# Patient Record
Sex: Female | Born: 1945
Health system: Southern US, Community
[De-identification: ages and names within clinical notes are randomized; demographics above are authoritative.]

## PROBLEM LIST (undated history)

## (undated) DIAGNOSIS — I10 Essential (primary) hypertension: Secondary | ICD-10-CM

## (undated) DIAGNOSIS — G709 Myoneural disorder, unspecified: Secondary | ICD-10-CM

## (undated) DIAGNOSIS — K219 Gastro-esophageal reflux disease without esophagitis: Secondary | ICD-10-CM

## (undated) HISTORY — PX: HERNIA REPAIR: SHX51

## (undated) HISTORY — DX: Myoneural disorder, unspecified: G70.9

## (undated) HISTORY — DX: Gastro-esophageal reflux disease without esophagitis: K21.9

## (undated) HISTORY — DX: Essential (primary) hypertension: I10

---

## 2011-01-08 DIAGNOSIS — R200 Anesthesia of skin: Secondary | ICD-10-CM | POA: Insufficient documentation

## 2011-01-31 DIAGNOSIS — J309 Allergic rhinitis, unspecified: Secondary | ICD-10-CM | POA: Insufficient documentation

## 2011-01-31 DIAGNOSIS — F32A Depression, unspecified: Secondary | ICD-10-CM | POA: Insufficient documentation

## 2011-03-26 DIAGNOSIS — S7410XA Injury of femoral nerve at hip and thigh level, unspecified leg, initial encounter: Secondary | ICD-10-CM | POA: Insufficient documentation

## 2011-12-29 DIAGNOSIS — Z780 Asymptomatic menopausal state: Secondary | ICD-10-CM | POA: Insufficient documentation

## 2011-12-29 DIAGNOSIS — H1010 Acute atopic conjunctivitis, unspecified eye: Secondary | ICD-10-CM | POA: Insufficient documentation

## 2011-12-29 DIAGNOSIS — H60549 Acute eczematoid otitis externa, unspecified ear: Secondary | ICD-10-CM | POA: Insufficient documentation

## 2011-12-29 DIAGNOSIS — N3281 Overactive bladder: Secondary | ICD-10-CM | POA: Insufficient documentation

## 2013-02-01 DIAGNOSIS — Z9889 Other specified postprocedural states: Secondary | ICD-10-CM | POA: Insufficient documentation

## 2013-02-01 DIAGNOSIS — K5900 Constipation, unspecified: Secondary | ICD-10-CM | POA: Insufficient documentation

## 2013-12-23 DIAGNOSIS — K449 Diaphragmatic hernia without obstruction or gangrene: Secondary | ICD-10-CM | POA: Insufficient documentation

## 2013-12-23 DIAGNOSIS — R9389 Abnormal findings on diagnostic imaging of other specified body structures: Secondary | ICD-10-CM | POA: Insufficient documentation

## 2013-12-23 DIAGNOSIS — K4120 Bilateral femoral hernia, without obstruction or gangrene, not specified as recurrent: Secondary | ICD-10-CM | POA: Insufficient documentation

## 2013-12-23 DIAGNOSIS — D649 Anemia, unspecified: Secondary | ICD-10-CM | POA: Insufficient documentation

## 2013-12-23 DIAGNOSIS — M810 Age-related osteoporosis without current pathological fracture: Secondary | ICD-10-CM | POA: Insufficient documentation

## 2013-12-23 DIAGNOSIS — R112 Nausea with vomiting, unspecified: Secondary | ICD-10-CM | POA: Insufficient documentation

## 2013-12-23 DIAGNOSIS — K4091 Unilateral inguinal hernia, without obstruction or gangrene, recurrent: Secondary | ICD-10-CM | POA: Insufficient documentation

## 2013-12-23 DIAGNOSIS — R1310 Dysphagia, unspecified: Secondary | ICD-10-CM | POA: Insufficient documentation

## 2013-12-23 DIAGNOSIS — S8266XA Nondisplaced fracture of lateral malleolus of unspecified fibula, initial encounter for closed fracture: Secondary | ICD-10-CM | POA: Insufficient documentation

## 2013-12-24 DIAGNOSIS — M659 Synovitis and tenosynovitis, unspecified: Secondary | ICD-10-CM | POA: Insufficient documentation

## 2013-12-24 DIAGNOSIS — J019 Acute sinusitis, unspecified: Secondary | ICD-10-CM | POA: Insufficient documentation

## 2015-05-14 DIAGNOSIS — R4589 Other symptoms and signs involving emotional state: Secondary | ICD-10-CM | POA: Diagnosis not present

## 2015-05-14 DIAGNOSIS — R32 Unspecified urinary incontinence: Secondary | ICD-10-CM | POA: Diagnosis not present

## 2015-05-14 DIAGNOSIS — I10 Essential (primary) hypertension: Secondary | ICD-10-CM | POA: Diagnosis not present

## 2015-08-31 DIAGNOSIS — Z1231 Encounter for screening mammogram for malignant neoplasm of breast: Secondary | ICD-10-CM | POA: Diagnosis not present

## 2015-09-19 DIAGNOSIS — S60222A Contusion of left hand, initial encounter: Secondary | ICD-10-CM | POA: Diagnosis not present

## 2015-11-16 DIAGNOSIS — Z1159 Encounter for screening for other viral diseases: Secondary | ICD-10-CM | POA: Diagnosis not present

## 2015-11-16 DIAGNOSIS — R5382 Chronic fatigue, unspecified: Secondary | ICD-10-CM | POA: Diagnosis not present

## 2015-11-16 DIAGNOSIS — Z0000 Encounter for general adult medical examination without abnormal findings: Secondary | ICD-10-CM | POA: Diagnosis not present

## 2015-11-16 DIAGNOSIS — D649 Anemia, unspecified: Secondary | ICD-10-CM | POA: Diagnosis not present

## 2015-11-16 DIAGNOSIS — Z13820 Encounter for screening for osteoporosis: Secondary | ICD-10-CM | POA: Diagnosis not present

## 2015-11-16 DIAGNOSIS — I10 Essential (primary) hypertension: Secondary | ICD-10-CM | POA: Diagnosis not present

## 2015-11-16 DIAGNOSIS — Z23 Encounter for immunization: Secondary | ICD-10-CM | POA: Diagnosis not present

## 2015-11-29 DIAGNOSIS — Z780 Asymptomatic menopausal state: Secondary | ICD-10-CM | POA: Diagnosis not present

## 2015-11-29 DIAGNOSIS — M8588 Other specified disorders of bone density and structure, other site: Secondary | ICD-10-CM | POA: Diagnosis not present

## 2015-12-11 DIAGNOSIS — H5201 Hypermetropia, right eye: Secondary | ICD-10-CM | POA: Diagnosis not present

## 2015-12-11 DIAGNOSIS — H2513 Age-related nuclear cataract, bilateral: Secondary | ICD-10-CM | POA: Diagnosis not present

## 2015-12-11 DIAGNOSIS — H524 Presbyopia: Secondary | ICD-10-CM | POA: Diagnosis not present

## 2015-12-11 DIAGNOSIS — H52223 Regular astigmatism, bilateral: Secondary | ICD-10-CM | POA: Diagnosis not present

## 2015-12-11 DIAGNOSIS — H5212 Myopia, left eye: Secondary | ICD-10-CM | POA: Diagnosis not present

## 2016-02-22 DIAGNOSIS — I10 Essential (primary) hypertension: Secondary | ICD-10-CM | POA: Diagnosis not present

## 2016-02-22 DIAGNOSIS — J40 Bronchitis, not specified as acute or chronic: Secondary | ICD-10-CM | POA: Diagnosis not present

## 2016-02-22 DIAGNOSIS — M818 Other osteoporosis without current pathological fracture: Secondary | ICD-10-CM | POA: Diagnosis not present

## 2016-05-15 DIAGNOSIS — I10 Essential (primary) hypertension: Secondary | ICD-10-CM | POA: Diagnosis not present

## 2016-05-22 DIAGNOSIS — D649 Anemia, unspecified: Secondary | ICD-10-CM | POA: Diagnosis not present

## 2016-05-22 DIAGNOSIS — R5382 Chronic fatigue, unspecified: Secondary | ICD-10-CM | POA: Diagnosis not present

## 2016-05-22 DIAGNOSIS — F430 Acute stress reaction: Secondary | ICD-10-CM | POA: Diagnosis not present

## 2016-05-22 DIAGNOSIS — M810 Age-related osteoporosis without current pathological fracture: Secondary | ICD-10-CM | POA: Diagnosis not present

## 2016-05-22 DIAGNOSIS — I10 Essential (primary) hypertension: Secondary | ICD-10-CM | POA: Diagnosis not present

## 2016-05-22 DIAGNOSIS — R413 Other amnesia: Secondary | ICD-10-CM | POA: Diagnosis not present

## 2016-05-22 DIAGNOSIS — F329 Major depressive disorder, single episode, unspecified: Secondary | ICD-10-CM | POA: Diagnosis not present

## 2016-07-11 DIAGNOSIS — N898 Other specified noninflammatory disorders of vagina: Secondary | ICD-10-CM | POA: Diagnosis not present

## 2016-07-11 DIAGNOSIS — R197 Diarrhea, unspecified: Secondary | ICD-10-CM | POA: Diagnosis not present

## 2016-08-14 DIAGNOSIS — Z1321 Encounter for screening for nutritional disorder: Secondary | ICD-10-CM | POA: Diagnosis not present

## 2016-08-14 DIAGNOSIS — M8580 Other specified disorders of bone density and structure, unspecified site: Secondary | ICD-10-CM | POA: Diagnosis not present

## 2016-08-14 DIAGNOSIS — Z79899 Other long term (current) drug therapy: Secondary | ICD-10-CM | POA: Diagnosis not present

## 2016-08-14 DIAGNOSIS — Z87891 Personal history of nicotine dependence: Secondary | ICD-10-CM | POA: Diagnosis not present

## 2016-08-25 DIAGNOSIS — Z87891 Personal history of nicotine dependence: Secondary | ICD-10-CM | POA: Diagnosis not present

## 2016-08-25 DIAGNOSIS — Z79899 Other long term (current) drug therapy: Secondary | ICD-10-CM | POA: Diagnosis not present

## 2016-08-25 DIAGNOSIS — Z1321 Encounter for screening for nutritional disorder: Secondary | ICD-10-CM | POA: Diagnosis not present

## 2016-08-25 DIAGNOSIS — M8580 Other specified disorders of bone density and structure, unspecified site: Secondary | ICD-10-CM | POA: Diagnosis not present

## 2016-09-03 DIAGNOSIS — M170 Bilateral primary osteoarthritis of knee: Secondary | ICD-10-CM | POA: Diagnosis not present

## 2016-09-15 DIAGNOSIS — Z1231 Encounter for screening mammogram for malignant neoplasm of breast: Secondary | ICD-10-CM | POA: Diagnosis not present

## 2016-09-18 DIAGNOSIS — M8580 Other specified disorders of bone density and structure, unspecified site: Secondary | ICD-10-CM | POA: Diagnosis not present

## 2016-09-19 DIAGNOSIS — D241 Benign neoplasm of right breast: Secondary | ICD-10-CM | POA: Diagnosis not present

## 2016-09-19 DIAGNOSIS — R922 Inconclusive mammogram: Secondary | ICD-10-CM | POA: Diagnosis not present

## 2016-11-27 DIAGNOSIS — I10 Essential (primary) hypertension: Secondary | ICD-10-CM | POA: Diagnosis not present

## 2016-12-17 DIAGNOSIS — Z1211 Encounter for screening for malignant neoplasm of colon: Secondary | ICD-10-CM | POA: Diagnosis not present

## 2016-12-17 DIAGNOSIS — Z1212 Encounter for screening for malignant neoplasm of rectum: Secondary | ICD-10-CM | POA: Diagnosis not present

## 2016-12-29 DIAGNOSIS — M170 Bilateral primary osteoarthritis of knee: Secondary | ICD-10-CM | POA: Insufficient documentation

## 2017-05-12 DIAGNOSIS — J029 Acute pharyngitis, unspecified: Secondary | ICD-10-CM | POA: Diagnosis not present

## 2017-05-12 DIAGNOSIS — R05 Cough: Secondary | ICD-10-CM | POA: Diagnosis not present

## 2017-05-12 DIAGNOSIS — J329 Chronic sinusitis, unspecified: Secondary | ICD-10-CM | POA: Diagnosis not present

## 2017-05-12 DIAGNOSIS — I10 Essential (primary) hypertension: Secondary | ICD-10-CM | POA: Diagnosis not present

## 2017-06-02 DIAGNOSIS — Z0000 Encounter for general adult medical examination without abnormal findings: Secondary | ICD-10-CM | POA: Diagnosis not present

## 2017-06-02 DIAGNOSIS — Z1211 Encounter for screening for malignant neoplasm of colon: Secondary | ICD-10-CM | POA: Diagnosis not present

## 2017-06-02 DIAGNOSIS — M8580 Other specified disorders of bone density and structure, unspecified site: Secondary | ICD-10-CM | POA: Diagnosis not present

## 2017-06-02 DIAGNOSIS — Z23 Encounter for immunization: Secondary | ICD-10-CM | POA: Diagnosis not present

## 2017-06-02 DIAGNOSIS — I10 Essential (primary) hypertension: Secondary | ICD-10-CM | POA: Diagnosis not present

## 2017-06-02 DIAGNOSIS — F329 Major depressive disorder, single episode, unspecified: Secondary | ICD-10-CM | POA: Diagnosis not present

## 2017-06-02 DIAGNOSIS — E559 Vitamin D deficiency, unspecified: Secondary | ICD-10-CM | POA: Diagnosis not present

## 2017-06-19 DIAGNOSIS — S61011A Laceration without foreign body of right thumb without damage to nail, initial encounter: Secondary | ICD-10-CM | POA: Diagnosis not present

## 2017-07-01 DIAGNOSIS — L853 Xerosis cutis: Secondary | ICD-10-CM | POA: Diagnosis not present

## 2017-07-01 DIAGNOSIS — L218 Other seborrheic dermatitis: Secondary | ICD-10-CM | POA: Diagnosis not present

## 2017-08-03 DIAGNOSIS — M25461 Effusion, right knee: Secondary | ICD-10-CM | POA: Diagnosis not present

## 2017-08-03 DIAGNOSIS — M25561 Pain in right knee: Secondary | ICD-10-CM | POA: Diagnosis not present

## 2017-08-03 DIAGNOSIS — M1712 Unilateral primary osteoarthritis, left knee: Secondary | ICD-10-CM | POA: Diagnosis not present

## 2017-08-03 DIAGNOSIS — M1711 Unilateral primary osteoarthritis, right knee: Secondary | ICD-10-CM | POA: Diagnosis not present

## 2017-08-06 DIAGNOSIS — E559 Vitamin D deficiency, unspecified: Secondary | ICD-10-CM | POA: Diagnosis not present

## 2017-08-06 DIAGNOSIS — M8580 Other specified disorders of bone density and structure, unspecified site: Secondary | ICD-10-CM | POA: Diagnosis not present

## 2017-08-06 DIAGNOSIS — I10 Essential (primary) hypertension: Secondary | ICD-10-CM | POA: Diagnosis not present

## 2017-08-06 DIAGNOSIS — M1711 Unilateral primary osteoarthritis, right knee: Secondary | ICD-10-CM | POA: Insufficient documentation

## 2017-08-11 DIAGNOSIS — I10 Essential (primary) hypertension: Secondary | ICD-10-CM | POA: Diagnosis not present

## 2017-08-11 DIAGNOSIS — M8580 Other specified disorders of bone density and structure, unspecified site: Secondary | ICD-10-CM | POA: Diagnosis not present

## 2017-08-13 DIAGNOSIS — M1711 Unilateral primary osteoarthritis, right knee: Secondary | ICD-10-CM | POA: Diagnosis not present

## 2017-08-13 DIAGNOSIS — M659 Synovitis and tenosynovitis, unspecified: Secondary | ICD-10-CM | POA: Diagnosis not present

## 2017-08-26 DIAGNOSIS — M25461 Effusion, right knee: Secondary | ICD-10-CM | POA: Diagnosis not present

## 2017-08-26 DIAGNOSIS — M1711 Unilateral primary osteoarthritis, right knee: Secondary | ICD-10-CM | POA: Diagnosis not present

## 2017-08-26 DIAGNOSIS — G5721 Lesion of femoral nerve, right lower limb: Secondary | ICD-10-CM | POA: Diagnosis not present

## 2017-08-26 DIAGNOSIS — M25561 Pain in right knee: Secondary | ICD-10-CM | POA: Diagnosis not present

## 2017-08-27 DIAGNOSIS — E559 Vitamin D deficiency, unspecified: Secondary | ICD-10-CM | POA: Diagnosis not present

## 2017-08-27 DIAGNOSIS — Z1321 Encounter for screening for nutritional disorder: Secondary | ICD-10-CM | POA: Diagnosis not present

## 2017-08-27 DIAGNOSIS — Z87891 Personal history of nicotine dependence: Secondary | ICD-10-CM | POA: Diagnosis not present

## 2017-08-27 DIAGNOSIS — Z79899 Other long term (current) drug therapy: Secondary | ICD-10-CM | POA: Diagnosis not present

## 2017-08-27 DIAGNOSIS — M8580 Other specified disorders of bone density and structure, unspecified site: Secondary | ICD-10-CM | POA: Diagnosis not present

## 2017-09-02 DIAGNOSIS — M8580 Other specified disorders of bone density and structure, unspecified site: Secondary | ICD-10-CM | POA: Diagnosis not present

## 2017-09-30 DIAGNOSIS — M8580 Other specified disorders of bone density and structure, unspecified site: Secondary | ICD-10-CM | POA: Diagnosis not present

## 2017-10-08 DIAGNOSIS — Z1231 Encounter for screening mammogram for malignant neoplasm of breast: Secondary | ICD-10-CM | POA: Diagnosis not present

## 2018-01-04 DIAGNOSIS — M8589 Other specified disorders of bone density and structure, multiple sites: Secondary | ICD-10-CM | POA: Diagnosis not present

## 2018-01-19 DIAGNOSIS — M1712 Unilateral primary osteoarthritis, left knee: Secondary | ICD-10-CM | POA: Diagnosis not present

## 2018-02-08 DIAGNOSIS — I10 Essential (primary) hypertension: Secondary | ICD-10-CM | POA: Diagnosis not present

## 2018-02-15 DIAGNOSIS — K219 Gastro-esophageal reflux disease without esophagitis: Secondary | ICD-10-CM | POA: Diagnosis not present

## 2018-02-15 DIAGNOSIS — I10 Essential (primary) hypertension: Secondary | ICD-10-CM | POA: Diagnosis not present

## 2018-02-15 DIAGNOSIS — R21 Rash and other nonspecific skin eruption: Secondary | ICD-10-CM | POA: Diagnosis not present

## 2018-07-14 DIAGNOSIS — Z79899 Other long term (current) drug therapy: Secondary | ICD-10-CM | POA: Diagnosis not present

## 2018-08-17 DIAGNOSIS — R7309 Other abnormal glucose: Secondary | ICD-10-CM | POA: Diagnosis not present

## 2018-08-17 DIAGNOSIS — I10 Essential (primary) hypertension: Secondary | ICD-10-CM | POA: Diagnosis not present

## 2018-08-19 DIAGNOSIS — R0989 Other specified symptoms and signs involving the circulatory and respiratory systems: Secondary | ICD-10-CM | POA: Diagnosis not present

## 2018-08-19 DIAGNOSIS — I10 Essential (primary) hypertension: Secondary | ICD-10-CM | POA: Diagnosis not present

## 2018-08-19 DIAGNOSIS — K219 Gastro-esophageal reflux disease without esophagitis: Secondary | ICD-10-CM | POA: Diagnosis not present

## 2018-09-14 DIAGNOSIS — E559 Vitamin D deficiency, unspecified: Secondary | ICD-10-CM | POA: Diagnosis not present

## 2018-09-14 DIAGNOSIS — Z87891 Personal history of nicotine dependence: Secondary | ICD-10-CM | POA: Diagnosis not present

## 2018-09-14 DIAGNOSIS — Z79899 Other long term (current) drug therapy: Secondary | ICD-10-CM | POA: Diagnosis not present

## 2018-09-14 DIAGNOSIS — M8580 Other specified disorders of bone density and structure, unspecified site: Secondary | ICD-10-CM | POA: Insufficient documentation

## 2018-10-21 DIAGNOSIS — H6120 Impacted cerumen, unspecified ear: Secondary | ICD-10-CM | POA: Diagnosis not present

## 2018-10-21 DIAGNOSIS — H6991 Unspecified Eustachian tube disorder, right ear: Secondary | ICD-10-CM | POA: Diagnosis not present

## 2018-10-21 DIAGNOSIS — H6691 Otitis media, unspecified, right ear: Secondary | ICD-10-CM | POA: Diagnosis not present

## 2018-10-27 DIAGNOSIS — Z1231 Encounter for screening mammogram for malignant neoplasm of breast: Secondary | ICD-10-CM | POA: Diagnosis not present

## 2018-11-19 DIAGNOSIS — H6121 Impacted cerumen, right ear: Secondary | ICD-10-CM | POA: Diagnosis not present

## 2018-11-19 DIAGNOSIS — J029 Acute pharyngitis, unspecified: Secondary | ICD-10-CM | POA: Diagnosis not present

## 2018-11-19 DIAGNOSIS — R590 Localized enlarged lymph nodes: Secondary | ICD-10-CM | POA: Diagnosis not present

## 2019-02-07 DIAGNOSIS — I10 Essential (primary) hypertension: Secondary | ICD-10-CM | POA: Diagnosis not present

## 2019-02-07 DIAGNOSIS — R7309 Other abnormal glucose: Secondary | ICD-10-CM | POA: Diagnosis not present

## 2019-02-14 DIAGNOSIS — Z0000 Encounter for general adult medical examination without abnormal findings: Secondary | ICD-10-CM | POA: Diagnosis not present

## 2019-02-14 DIAGNOSIS — Z23 Encounter for immunization: Secondary | ICD-10-CM | POA: Diagnosis not present

## 2019-02-14 DIAGNOSIS — Z780 Asymptomatic menopausal state: Secondary | ICD-10-CM | POA: Diagnosis not present

## 2019-02-14 DIAGNOSIS — Z79899 Other long term (current) drug therapy: Secondary | ICD-10-CM | POA: Diagnosis not present

## 2019-02-14 DIAGNOSIS — Z1231 Encounter for screening mammogram for malignant neoplasm of breast: Secondary | ICD-10-CM | POA: Diagnosis not present

## 2019-04-21 DIAGNOSIS — I10 Essential (primary) hypertension: Secondary | ICD-10-CM | POA: Diagnosis not present

## 2019-04-21 DIAGNOSIS — N3281 Overactive bladder: Secondary | ICD-10-CM | POA: Diagnosis not present

## 2019-04-21 DIAGNOSIS — R413 Other amnesia: Secondary | ICD-10-CM | POA: Diagnosis not present

## 2019-06-20 DIAGNOSIS — I10 Essential (primary) hypertension: Secondary | ICD-10-CM | POA: Diagnosis not present

## 2019-06-20 DIAGNOSIS — N3281 Overactive bladder: Secondary | ICD-10-CM | POA: Diagnosis not present

## 2019-06-23 DIAGNOSIS — G3184 Mild cognitive impairment, so stated: Secondary | ICD-10-CM | POA: Diagnosis not present

## 2019-07-20 DIAGNOSIS — G319 Degenerative disease of nervous system, unspecified: Secondary | ICD-10-CM | POA: Diagnosis not present

## 2019-07-20 DIAGNOSIS — F0390 Unspecified dementia without behavioral disturbance: Secondary | ICD-10-CM | POA: Diagnosis not present

## 2019-07-20 DIAGNOSIS — G3184 Mild cognitive impairment, so stated: Secondary | ICD-10-CM | POA: Diagnosis not present

## 2019-08-16 DIAGNOSIS — I10 Essential (primary) hypertension: Secondary | ICD-10-CM | POA: Diagnosis not present

## 2019-08-16 DIAGNOSIS — N3281 Overactive bladder: Secondary | ICD-10-CM | POA: Diagnosis not present

## 2019-08-16 DIAGNOSIS — G3184 Mild cognitive impairment, so stated: Secondary | ICD-10-CM | POA: Diagnosis not present

## 2019-08-16 DIAGNOSIS — K219 Gastro-esophageal reflux disease without esophagitis: Secondary | ICD-10-CM | POA: Diagnosis not present

## 2019-10-05 DIAGNOSIS — Z79899 Other long term (current) drug therapy: Secondary | ICD-10-CM | POA: Diagnosis not present

## 2019-10-05 DIAGNOSIS — M8580 Other specified disorders of bone density and structure, unspecified site: Secondary | ICD-10-CM | POA: Diagnosis not present

## 2019-10-05 DIAGNOSIS — Z87891 Personal history of nicotine dependence: Secondary | ICD-10-CM | POA: Diagnosis not present

## 2019-10-05 DIAGNOSIS — E559 Vitamin D deficiency, unspecified: Secondary | ICD-10-CM | POA: Diagnosis not present

## 2019-11-02 DIAGNOSIS — Z1231 Encounter for screening mammogram for malignant neoplasm of breast: Secondary | ICD-10-CM | POA: Diagnosis not present

## 2019-11-02 LAB — HM MAMMOGRAPHY

## 2019-12-05 DIAGNOSIS — Z9889 Other specified postprocedural states: Secondary | ICD-10-CM | POA: Diagnosis not present

## 2019-12-05 DIAGNOSIS — M1712 Unilateral primary osteoarthritis, left knee: Secondary | ICD-10-CM | POA: Diagnosis not present

## 2019-12-07 DIAGNOSIS — Z20822 Contact with and (suspected) exposure to covid-19: Secondary | ICD-10-CM | POA: Diagnosis not present

## 2019-12-07 DIAGNOSIS — R6883 Chills (without fever): Secondary | ICD-10-CM | POA: Diagnosis not present

## 2019-12-07 DIAGNOSIS — R197 Diarrhea, unspecified: Secondary | ICD-10-CM | POA: Diagnosis not present

## 2019-12-07 DIAGNOSIS — R519 Headache, unspecified: Secondary | ICD-10-CM | POA: Diagnosis not present

## 2019-12-15 DIAGNOSIS — M1711 Unilateral primary osteoarthritis, right knee: Secondary | ICD-10-CM | POA: Diagnosis not present

## 2019-12-19 DIAGNOSIS — K219 Gastro-esophageal reflux disease without esophagitis: Secondary | ICD-10-CM | POA: Diagnosis not present

## 2019-12-19 DIAGNOSIS — N3281 Overactive bladder: Secondary | ICD-10-CM | POA: Diagnosis not present

## 2019-12-19 DIAGNOSIS — R0982 Postnasal drip: Secondary | ICD-10-CM | POA: Diagnosis not present

## 2019-12-19 DIAGNOSIS — G3184 Mild cognitive impairment, so stated: Secondary | ICD-10-CM | POA: Diagnosis not present

## 2019-12-19 DIAGNOSIS — Z780 Asymptomatic menopausal state: Secondary | ICD-10-CM | POA: Diagnosis not present

## 2019-12-19 DIAGNOSIS — I10 Essential (primary) hypertension: Secondary | ICD-10-CM | POA: Diagnosis not present

## 2020-02-06 DIAGNOSIS — M1712 Unilateral primary osteoarthritis, left knee: Secondary | ICD-10-CM | POA: Diagnosis not present

## 2020-02-06 DIAGNOSIS — M65161 Other infective (teno)synovitis, right knee: Secondary | ICD-10-CM | POA: Diagnosis not present

## 2020-04-06 DIAGNOSIS — H9203 Otalgia, bilateral: Secondary | ICD-10-CM | POA: Diagnosis not present

## 2020-04-06 DIAGNOSIS — H6123 Impacted cerumen, bilateral: Secondary | ICD-10-CM | POA: Diagnosis not present

## 2020-05-28 DIAGNOSIS — R7309 Other abnormal glucose: Secondary | ICD-10-CM | POA: Diagnosis not present

## 2020-05-28 DIAGNOSIS — M8580 Other specified disorders of bone density and structure, unspecified site: Secondary | ICD-10-CM | POA: Diagnosis not present

## 2020-05-28 DIAGNOSIS — Z0000 Encounter for general adult medical examination without abnormal findings: Secondary | ICD-10-CM | POA: Diagnosis not present

## 2020-05-28 DIAGNOSIS — I10 Essential (primary) hypertension: Secondary | ICD-10-CM | POA: Diagnosis not present

## 2020-06-13 ENCOUNTER — Ambulatory Visit (INDEPENDENT_AMBULATORY_CARE_PROVIDER_SITE_OTHER): Payer: PPO | Admitting: Nurse Practitioner

## 2020-06-13 ENCOUNTER — Encounter: Payer: Self-pay | Admitting: Nurse Practitioner

## 2020-06-13 ENCOUNTER — Other Ambulatory Visit: Payer: Self-pay

## 2020-06-13 DIAGNOSIS — K219 Gastro-esophageal reflux disease without esophagitis: Secondary | ICD-10-CM

## 2020-06-13 DIAGNOSIS — Z1211 Encounter for screening for malignant neoplasm of colon: Secondary | ICD-10-CM | POA: Insufficient documentation

## 2020-06-13 DIAGNOSIS — Z139 Encounter for screening, unspecified: Secondary | ICD-10-CM | POA: Diagnosis not present

## 2020-06-13 DIAGNOSIS — Z7689 Persons encountering health services in other specified circumstances: Secondary | ICD-10-CM | POA: Diagnosis not present

## 2020-06-13 DIAGNOSIS — I10 Essential (primary) hypertension: Secondary | ICD-10-CM

## 2020-06-13 DIAGNOSIS — R4189 Other symptoms and signs involving cognitive functions and awareness: Secondary | ICD-10-CM | POA: Diagnosis not present

## 2020-06-13 DIAGNOSIS — D509 Iron deficiency anemia, unspecified: Secondary | ICD-10-CM | POA: Diagnosis not present

## 2020-06-13 DIAGNOSIS — E559 Vitamin D deficiency, unspecified: Secondary | ICD-10-CM | POA: Diagnosis not present

## 2020-06-13 NOTE — Patient Instructions (Signed)
Please come to next appointment fasting so we can draw labs.

## 2020-06-13 NOTE — Assessment & Plan Note (Signed)
-  will check HCV with next set of labs

## 2020-06-13 NOTE — Assessment & Plan Note (Signed)
-  insidious onset -she is poor historian, but states that she had recent labs (no results in epic) -will get labs with next appt

## 2020-06-13 NOTE — Assessment & Plan Note (Signed)
-  obtain records 

## 2020-06-13 NOTE — Assessment & Plan Note (Signed)
-  no issues today -takes pantoprazole daily

## 2020-06-13 NOTE — Assessment & Plan Note (Signed)
-  takes Vit D/calcium supplement

## 2020-06-13 NOTE — Assessment & Plan Note (Signed)
-  BP elevated today -takes amlodipine and valsartan

## 2020-06-13 NOTE — Assessment & Plan Note (Signed)
-  takes daily iron supplement

## 2020-06-13 NOTE — Progress Notes (Signed)
New Patient Office Visit  Subjective:  Patient ID: Michelle Jackson, female    DOB: 15-Dec-1945  Age: 75 y.o. MRN: [id number]  CC:  Chief Complaint  Patient presents with  . New Patient (Initial Visit)    HPI Michelle Jackson presents for new patient visit. Transferring care from [facility] with [place]. She had AWV on 05/28/20. No labs available in Epic. She has q6 month appointments, and she states she had labs drawn in March. She is poor historian and has completed no pre-visit paperwork. Will have to get records from [facility].  She states that her memory is not what it used to be, and she is concerned about that.  Past Medical History:  Diagnosis Date  . GERD (gastroesophageal reflux disease)   . Hypertension   . Neuromuscular disorder ([place])    femoral nerve severed during hernia surgery; wears right knee brace    Past Surgical History:  Procedure Laterality Date  . HERNIA REPAIR      History reviewed. No pertinent family history.  Social History   Socioeconomic History  . Marital status: Unknown    Spouse name: Not on file  . Number of children: Not on file  . Years of education: Not on file  . Highest education level: Not on file  Occupational History  . Not on file  Tobacco Use  . Smoking status: Never Smoker  . Smokeless tobacco: Never Used  Substance and Sexual Activity  . Alcohol use: Yes    Alcohol/week: 1.0 standard drink    Types: 1 Glasses of wine per week    Comment: occaisional   . Drug use: Never  . Sexual activity: Yes    Birth control/protection: Post-menopausal  Other Topics Concern  . Not on file  Social History Narrative  . Not on file   Social Determinants of Health   Financial Resource Strain: Not on file  Food Insecurity: Not on file  Transportation Needs: Not on file  Physical Activity: Not on file  Stress: Not on file  Social Connections: Not on file  Intimate Partner Violence: Not on file    ROS Review of Systems   Constitutional: Negative.   Respiratory: Negative.   Cardiovascular: Negative.   Neurological:       Poor memory    Objective:   Today's Vitals: BP (!) 176/77   Pulse 74   Temp 98 F (36.7 C)   Resp 20   Ht 5' (1.524 m)   Wt 91 lb (41.3 kg)   LMP  (LMP Unknown)   SpO2 93%   BMI 17.77 kg/m   Physical Exam Constitutional:      Appearance: Normal appearance.  Cardiovascular:     Rate and Rhythm: Normal rate and regular rhythm.     Pulses: Normal pulses.     Heart sounds: Murmur heard.      Comments: Aortic murmur 2/6 Pulmonary:     Effort: Pulmonary effort is normal.     Breath sounds: Normal breath sounds.  Musculoskeletal:        General: Normal range of motion.  Neurological:     General: No focal deficit present.     Mental Status: She is alert and oriented to person, place, and time.  Psychiatric:        Mood and Affect: Mood normal.        Behavior: Behavior normal.        Thought Content: Thought content normal.  Judgment: Judgment normal.     Assessment & Plan:   Problem List Items Addressed This Visit      Cardiovascular and Mediastinum   Hypertension, essential    -BP elevated today -takes amlodipine and valsartan       Relevant Medications   valsartan (DIOVAN) 320 MG tablet   amLODipine (NORVASC) 5 MG tablet     Digestive   Esophageal reflux    -no issues today -takes pantoprazole daily      Relevant Medications   pantoprazole (PROTONIX) 40 MG tablet     Other   Encounter to establish care    -obtain records      Screening due    -will check HCV with next set of labs      Vitamin D deficiency    -takes Vit D/calcium supplement      Iron deficiency anemia    -takes daily iron supplement      Relevant Medications   ferrous sulfate 325 (65 FE) MG tablet   Cognitive change    -insidious onset -she is poor historian, but states that she had recent labs (no results in epic) -will get labs with next appt          Outpatient Encounter Medications as of 06/13/2020  Medication Sig  . amLODipine (NORVASC) 5 MG tablet Take 5 mg by mouth daily.  Marland Kitchen ascorbic acid (VITAMIN C) 500 MG tablet Take 500 mg by mouth daily.  . calcium-vitamin D 250-100 MG-UNIT tablet Take 1 tablet by mouth 2 (two) times daily.  . cholecalciferol (VITAMIN D3) 25 MCG (1000 UNIT) tablet Take 1,000 Units by mouth daily.  . ferrous sulfate 325 (65 FE) MG tablet Take 65 mg by mouth daily with breakfast.  . pantoprazole (PROTONIX) 40 MG tablet Take 40 mg by mouth 2 (two) times daily.  . valsartan (DIOVAN) 320 MG tablet Take 320 mg by mouth daily.   No facility-administered encounter medications on file as of 06/13/2020.    Follow-up: Return in about 1 month (around 07/14/2020) for Memory Impairment (40 minute appointment to review records and perform MMSE).   Noreene Larsson, NP

## 2020-07-18 ENCOUNTER — Ambulatory Visit (INDEPENDENT_AMBULATORY_CARE_PROVIDER_SITE_OTHER): Payer: PPO | Admitting: Nurse Practitioner

## 2020-07-18 ENCOUNTER — Encounter: Payer: Self-pay | Admitting: Nurse Practitioner

## 2020-07-18 ENCOUNTER — Other Ambulatory Visit: Payer: Self-pay

## 2020-07-18 DIAGNOSIS — E559 Vitamin D deficiency, unspecified: Secondary | ICD-10-CM | POA: Diagnosis not present

## 2020-07-18 DIAGNOSIS — I10 Essential (primary) hypertension: Secondary | ICD-10-CM | POA: Diagnosis not present

## 2020-07-18 DIAGNOSIS — R4189 Other symptoms and signs involving cognitive functions and awareness: Secondary | ICD-10-CM | POA: Diagnosis not present

## 2020-07-18 DIAGNOSIS — D509 Iron deficiency anemia, unspecified: Secondary | ICD-10-CM

## 2020-07-18 NOTE — Assessment & Plan Note (Signed)
BP Readings from Last 3 Encounters:  07/18/20 (!) 156/75  06/13/20 (!) 176/77   -continue amlodipine and valsartan -she states she has some white coat syndrome, but home BPs have been great

## 2020-07-18 NOTE — Progress Notes (Addendum)
Acute Office Visit  Subjective:    Patient ID: Michelle Jackson, female    DOB: 1945/08/07, 75 y.o.   MRN: [id number]  Chief Complaint  Patient presents with  . Hypertension    HPI Patient is in today for memory loss.  From a note from her previous PCP, Nena Polio, she has mild cognitive impairment and had neuro consult previously (per note from 12/19/19). There are no recent neuro notes available or mention of neurologist that completed the consult.  At last OV, she had BP 176/77, but stated that the MD office makes her nervous. She states her home readings run in the 120s-130s at home.  She takes valsartan and amlodipine for BP.  Past Medical History:  Diagnosis Date  . GERD (gastroesophageal reflux disease)   . Hypertension   . Neuromuscular disorder ([place])    femoral nerve severed during hernia surgery; wears right knee brace    Past Surgical History:  Procedure Laterality Date  . HERNIA REPAIR      History reviewed. No pertinent family history.  Social History   Socioeconomic History  . Marital status: Unknown    Spouse name: Not on file  . Number of children: Not on file  . Years of education: Not on file  . Highest education level: Not on file  Occupational History  . Not on file  Tobacco Use  . Smoking status: Never Smoker  . Smokeless tobacco: Never Used  Substance and Sexual Activity  . Alcohol use: Yes    Alcohol/week: 1.0 standard drink    Types: 1 Glasses of wine per week    Comment: occaisional   . Drug use: Never  . Sexual activity: Yes    Birth control/protection: Post-menopausal  Other Topics Concern  . Not on file  Social History Narrative  . Not on file   Social Determinants of Health   Financial Resource Strain: Not on file  Food Insecurity: Not on file  Transportation Needs: Not on file  Physical Activity: Not on file  Stress: Not on file  Social Connections: Not on file  Intimate Partner Violence: Not on file    Outpatient  Medications Prior to Visit  Medication Sig Dispense Refill  . amLODipine (NORVASC) 5 MG tablet Take 5 mg by mouth daily.    Marland Kitchen ascorbic acid (VITAMIN C) 500 MG tablet Take 500 mg by mouth daily.    . calcium-vitamin D 250-100 MG-UNIT tablet Take 1 tablet by mouth 2 (two) times daily.    . cholecalciferol (VITAMIN D3) 25 MCG (1000 UNIT) tablet Take 1,000 Units by mouth daily.    . ferrous sulfate 325 (65 FE) MG tablet Take 65 mg by mouth daily with breakfast.    . pantoprazole (PROTONIX) 40 MG tablet Take 40 mg by mouth 2 (two) times daily.    . valsartan (DIOVAN) 320 MG tablet Take 320 mg by mouth daily.     No facility-administered medications prior to visit.    No Known Allergies  Review of Systems  Constitutional: Negative.   Respiratory: Negative.   Cardiovascular: Negative.   Neurological:       Some memory loss  Psychiatric/Behavioral: Negative.        Objective:    Physical Exam Constitutional:      Appearance: Normal appearance.  Cardiovascular:     Rate and Rhythm: Normal rate and regular rhythm.     Pulses: Normal pulses.     Heart sounds: Normal heart sounds.  Pulmonary:  Effort: Pulmonary effort is normal.     Breath sounds: Normal breath sounds.  Musculoskeletal:        General: Normal range of motion.  Neurological:     Mental Status: She is alert.  Psychiatric:        Mood and Affect: Mood normal.        Behavior: Behavior normal.        Thought Content: Thought content normal.        Judgment: Judgment normal.     BP (!) 156/75   Pulse 80   Temp 97.9 F (36.6 C)   Resp 20   Ht 5' (1.524 m)   Wt 89 lb (40.4 kg)   LMP  (LMP Unknown)   SpO2 96%   BMI 17.38 kg/m  Wt Readings from Last 3 Encounters:  07/18/20 89 lb (40.4 kg)  06/13/20 91 lb (41.3 kg)    Health Maintenance Due  Topic Date Due  . Hepatitis C Screening  Never done  . COLONOSCOPY (Pts 45-60yrs Insurance coverage will need to be confirmed)  Never done  . MAMMOGRAM  Never  done  . DEXA SCAN  Never done    There are no preventive care reminders to display for this patient.   No results found for: TSH No results found for: WBC, HGB, HCT, MCV, PLT No results found for: NA, K, CHLORIDE, CO2, GLUCOSE, BUN, CREATININE, BILITOT, ALKPHOS, AST, ALT, PROT, ALBUMIN, CALCIUM, ANIONGAP, EGFR, GFR No results found for: CHOL No results found for: HDL No results found for: LDLCALC No results found for: TRIG No results found for: CHOLHDL No results found for: HGBA1C     Assessment & Plan:   Problem List Items Addressed This Visit      Cardiovascular and Mediastinum   Hypertension, essential    BP Readings from Last 3 Encounters:  07/18/20 (!) 156/75  06/13/20 (!) 176/77   -continue amlodipine and valsartan -she states she has some white coat syndrome, but home BPs have been great      Relevant Orders   CBC with Differential/Platelet   CMP14+EGFR   Lipid Panel With LDL/HDL Ratio     Other   Vitamin D deficiency    -will check vit D with next set of labs      Relevant Orders   VITAMIN D 25 Hydroxy (Vit-D Deficiency, Fractures)   Iron deficiency anemia    -will check iron panel with set of labs      Relevant Orders   Iron, TIBC and Ferritin Panel   Cognitive change    -no recent neuro notes in Epic -has documented mild cognitive impairment from previous PCP (12/19/19 note) -will check set of labs today including B12, TSH, and vit d -MMSE today is 28/30, so not likely dementia -she is requesting more extensive workup because she has noticed forgetfulness; will refer to neuro      Relevant Orders   CBC with Differential/Platelet   CMP14+EGFR   Lipid Panel With LDL/HDL Ratio   VITAMIN D 25 Hydroxy (Vit-D Deficiency, Fractures)   TSH   B12   Ambulatory referral to Neurology       No orders of the defined types were placed in this encounter.    Noreene Larsson, NP

## 2020-07-18 NOTE — Assessment & Plan Note (Addendum)
-  no recent neuro notes in Epic -has documented mild cognitive impairment from previous PCP (12/19/19 note) -will check set of labs today including B12, TSH, and vit d -MMSE today is 28/30, so not likely dementia -she is requesting more extensive workup because she has noticed forgetfulness; will refer to neuro

## 2020-07-18 NOTE — Patient Instructions (Signed)
Please have fasting labs drawn as soon as possible.  For your next appointment in 6 months, please have fasting labs drawn 2-3 days prior to your appointment so we can discuss the results during your office visit.

## 2020-07-18 NOTE — Assessment & Plan Note (Signed)
-  will check vit D with next set of labs 

## 2020-07-18 NOTE — Addendum Note (Signed)
Addended by: Demetrius Revel on: 07/18/2020 09:30 AM   Modules accepted: Orders

## 2020-07-18 NOTE — Assessment & Plan Note (Signed)
-  will check iron panel with set of labs

## 2020-07-19 LAB — CMP14+EGFR
ALT: 15 IU/L (ref 0–32)
AST: 23 IU/L (ref 0–40)
Albumin/Globulin Ratio: 2.4 — ABNORMAL HIGH (ref 1.2–2.2)
Albumin: 4.7 g/dL (ref 3.7–4.7)
Alkaline Phosphatase: 49 IU/L (ref 44–121)
BUN/Creatinine Ratio: 18 (ref 12–28)
BUN: 14 mg/dL (ref 8–27)
Bilirubin Total: 0.7 mg/dL (ref 0.0–1.2)
CO2: 25 mmol/L (ref 20–29)
Calcium: 11 mg/dL — ABNORMAL HIGH (ref 8.7–10.3)
Chloride: 100 mmol/L (ref 96–106)
Creatinine, Ser: 0.77 mg/dL (ref 0.57–1.00)
Globulin, Total: 2 g/dL (ref 1.5–4.5)
Glucose: 108 mg/dL — ABNORMAL HIGH (ref 65–99)
Potassium: 4 mmol/L (ref 3.5–5.2)
Sodium: 140 mmol/L (ref 134–144)
Total Protein: 6.7 g/dL (ref 6.0–8.5)
eGFR: 81 mL/min/{1.73_m2} (ref >59)

## 2020-07-19 LAB — CBC WITH DIFFERENTIAL/PLATELET
Basophils Absolute: 0 10*3/uL (ref 0.0–0.2)
Basos: 0 %
EOS (ABSOLUTE): 0.1 10*3/uL (ref 0.0–0.4)
Eos: 3 %
Hematocrit: 42 % (ref 34.0–46.6)
Hemoglobin: 14 g/dL (ref 11.1–15.9)
Immature Grans (Abs): 0 10*3/uL (ref 0.0–0.1)
Immature Granulocytes: 0 %
Lymphocytes Absolute: 1.5 10*3/uL (ref 0.7–3.1)
Lymphs: 30 %
MCH: 32 pg (ref 26.6–33.0)
MCHC: 33.3 g/dL (ref 31.5–35.7)
MCV: 96 fL (ref 79–97)
Monocytes Absolute: 0.3 10*3/uL (ref 0.1–0.9)
Monocytes: 6 %
Neutrophils Absolute: 3 10*3/uL (ref 1.4–7.0)
Neutrophils: 61 %
Platelets: 244 10*3/uL (ref 150–450)
RBC: 4.37 x10E6/uL (ref 3.77–5.28)
RDW: 12.2 % (ref 11.7–15.4)
WBC: 4.9 10*3/uL (ref 3.4–10.8)

## 2020-07-19 LAB — VITAMIN B12: Vitamin B-12: 1023 pg/mL (ref 232–1245)

## 2020-07-19 LAB — IRON,TIBC AND FERRITIN PANEL
Ferritin: 55 ng/mL (ref 15–150)
Iron Saturation: 28 % (ref 15–55)
Iron: 103 ug/dL (ref 27–139)
Total Iron Binding Capacity: 364 ug/dL (ref 250–450)
UIBC: 261 ug/dL (ref 118–369)

## 2020-07-19 LAB — LIPID PANEL WITH LDL/HDL RATIO
Cholesterol, Total: 219 mg/dL — ABNORMAL HIGH (ref 100–199)
HDL: 85 mg/dL (ref >39)
LDL Chol Calc (NIH): 114 mg/dL — ABNORMAL HIGH (ref 0–99)
LDL/HDL Ratio: 1.3 ratio (ref 0.0–3.2)
Triglycerides: 116 mg/dL (ref 0–149)
VLDL Cholesterol Cal: 20 mg/dL (ref 5–40)

## 2020-07-19 LAB — TSH: TSH: 1.57 u[IU]/mL (ref 0.450–4.500)

## 2020-07-19 LAB — VITAMIN D 25 HYDROXY (VIT D DEFICIENCY, FRACTURES): Vit D, 25-Hydroxy: 54.5 ng/mL (ref 30.0–100.0)

## 2020-07-19 NOTE — Progress Notes (Signed)
Cholesterol is slightly elevated, so decrease fried/fatty foods. Her calcium is elevated, but Vit D looks great. I will add of a parathyroid hormone level to the labs we collected yesterday.

## 2020-07-22 LAB — SPECIMEN STATUS REPORT

## 2020-07-22 LAB — PTH, INTACT AND CALCIUM: Calcium: 10.9 mg/dL — ABNORMAL HIGH (ref 8.7–10.3)

## 2020-07-23 ENCOUNTER — Other Ambulatory Visit: Payer: Self-pay | Admitting: Nurse Practitioner

## 2020-07-23 NOTE — Progress Notes (Signed)
They were unable to add PTH to her labs, can she come in to get a PTH drawn?

## 2020-07-25 LAB — PTH, INTACT AND CALCIUM
Calcium: 9.7 mg/dL (ref 8.7–10.3)
PTH: 40 pg/mL (ref 15–65)

## 2020-07-25 NOTE — Progress Notes (Signed)
Calcium and PTH were in the normal range with this lab check, so no medication or referrals are needed.

## 2020-09-11 ENCOUNTER — Ambulatory Visit (INDEPENDENT_AMBULATORY_CARE_PROVIDER_SITE_OTHER): Payer: PPO | Admitting: Nurse Practitioner

## 2020-09-11 ENCOUNTER — Other Ambulatory Visit: Payer: Self-pay

## 2020-09-11 ENCOUNTER — Telehealth: Payer: Self-pay

## 2020-09-11 ENCOUNTER — Encounter: Payer: Self-pay | Admitting: Nurse Practitioner

## 2020-09-11 DIAGNOSIS — L299 Pruritus, unspecified: Secondary | ICD-10-CM | POA: Diagnosis not present

## 2020-09-11 NOTE — Patient Instructions (Signed)
If itching gets worse or pain develops, return to the clinic.

## 2020-09-11 NOTE — Telephone Encounter (Signed)
Error

## 2020-09-11 NOTE — Progress Notes (Signed)
Acute Office Visit  Subjective:    Patient ID: Michelle Jackson, female    DOB: 02/21/1946, 75 y.o.   MRN: [id number]  Chief Complaint  Patient presents with   Otalgia    Both ears feel stopped up, ongoing for a few weeks.    Otalgia  Pertinent negatives include no ear discharge, rhinorrhea or sore throat.  Patient is in today for ear issues. She states that they feel itchy/clogged up and have been ongoing for several weeks. Her left ear is itching much more than the right. No changes to her hearing.  Past Medical History:  Diagnosis Date   GERD (gastroesophageal reflux disease)    Hypertension    Neuromuscular disorder ([place])    femoral nerve severed during hernia surgery; wears right knee brace    Past Surgical History:  Procedure Laterality Date   HERNIA REPAIR      History reviewed. No pertinent family history.  Social History   Socioeconomic History   Marital status: Unknown    Spouse name: Not on file   Number of children: Not on file   Years of education: Not on file   Highest education level: Not on file  Occupational History   Not on file  Tobacco Use   Smoking status: Never   Smokeless tobacco: Never  Substance and Sexual Activity   Alcohol use: Yes    Alcohol/week: 1.0 standard drink    Types: 1 Glasses of wine per week    Comment: occaisional    Drug use: Never   Sexual activity: Yes    Birth control/protection: Post-menopausal  Other Topics Concern   Not on file  Social History Narrative   Not on file   Social Determinants of Health   Financial Resource Strain: Not on file  Food Insecurity: Not on file  Transportation Needs: Not on file  Physical Activity: Not on file  Stress: Not on file  Social Connections: Not on file  Intimate Partner Violence: Not on file    Outpatient Medications Prior to Visit  Medication Sig Dispense Refill   amLODipine (NORVASC) 5 MG tablet Take 5 mg by mouth daily.     ascorbic acid (VITAMIN C) 500 MG tablet  Take 500 mg by mouth daily.     calcium-vitamin D 250-100 MG-UNIT tablet Take 1 tablet by mouth 2 (two) times daily.     cholecalciferol (VITAMIN D3) 25 MCG (1000 UNIT) tablet Take 1,000 Units by mouth daily.     ferrous sulfate 325 (65 FE) MG tablet Take 65 mg by mouth daily with breakfast.     pantoprazole (PROTONIX) 40 MG tablet Take 40 mg by mouth 2 (two) times daily.     valsartan (DIOVAN) 320 MG tablet Take 320 mg by mouth daily.     No facility-administered medications prior to visit.    No Known Allergies  Review of Systems  HENT:  Negative for congestion, ear discharge, ear pain, rhinorrhea, sinus pressure, sinus pain and sore throat.        Left ear has been itching in her auditory canal      Objective:    Physical Exam Constitutional:      Appearance: Normal appearance.  HENT:     Right Ear: Tympanic membrane, ear canal and external ear normal.     Left Ear: Tympanic membrane, ear canal and external ear normal.  Neurological:     Mental Status: She is alert.    BP 135/72 (BP Location: Left Arm, Patient  Position: Sitting, Cuff Size: Normal)   Pulse 60   Temp (!) 97.5 F (36.4 C) (Temporal)   Ht 5' (1.524 m)   Wt 90 lb (40.8 kg)   LMP  (LMP Unknown)   SpO2 96%   BMI 17.58 kg/m  Wt Readings from Last 3 Encounters:  09/11/20 90 lb (40.8 kg)  07/18/20 89 lb (40.4 kg)  06/13/20 91 lb (41.3 kg)    Health Maintenance Due  Topic Date Due   Hepatitis C Screening  Never done   COLONOSCOPY (Pts 45-42yrs Insurance coverage will need to be confirmed)  Never done   MAMMOGRAM  Never done   DEXA SCAN  Never done    There are no preventive care reminders to display for this patient.   Lab Results  Component Value Date   TSH 1.570 07/18/2020   Lab Results  Component Value Date   WBC 4.9 07/18/2020   HGB 14.0 07/18/2020   HCT 42.0 07/18/2020   MCV 96 07/18/2020   PLT 244 07/18/2020   Lab Results  Component Value Date   NA 140 07/18/2020   K 4.0 07/18/2020    CO2 25 07/18/2020   GLUCOSE 108 (H) 07/18/2020   BUN 14 07/18/2020   CREATININE 0.77 07/18/2020   BILITOT 0.7 07/18/2020   ALKPHOS 49 07/18/2020   AST 23 07/18/2020   ALT 15 07/18/2020   PROT 6.7 07/18/2020   ALBUMIN 4.7 07/18/2020   CALCIUM 9.7 07/24/2020   EGFR 81 07/18/2020   Lab Results  Component Value Date   CHOL 219 (H) 07/18/2020   Lab Results  Component Value Date   HDL 85 07/18/2020   Lab Results  Component Value Date   LDLCALC 114 (H) 07/18/2020   Lab Results  Component Value Date   TRIG 116 07/18/2020   No results found for: CHOLHDL No results found for: HGBA1C     Assessment & Plan:   Problem List Items Addressed This Visit       Musculoskeletal and Integument   Pruritus    -to left ear -no discernable cause; no erythema or cerumen impaction visualized on exam         No orders of the defined types were placed in this encounter.    Noreene Larsson, NP

## 2020-09-11 NOTE — Assessment & Plan Note (Signed)
-  to left ear -no discernable cause; no erythema or cerumen impaction visualized on exam

## 2020-09-27 DIAGNOSIS — J329 Chronic sinusitis, unspecified: Secondary | ICD-10-CM | POA: Diagnosis not present

## 2020-09-27 DIAGNOSIS — T783XXA Angioneurotic edema, initial encounter: Secondary | ICD-10-CM | POA: Diagnosis not present

## 2020-09-27 DIAGNOSIS — H1013 Acute atopic conjunctivitis, bilateral: Secondary | ICD-10-CM | POA: Diagnosis not present

## 2020-10-01 ENCOUNTER — Other Ambulatory Visit: Payer: Self-pay

## 2020-10-01 ENCOUNTER — Encounter: Payer: Self-pay | Admitting: Nurse Practitioner

## 2020-10-01 ENCOUNTER — Ambulatory Visit (INDEPENDENT_AMBULATORY_CARE_PROVIDER_SITE_OTHER): Payer: PPO | Admitting: Nurse Practitioner

## 2020-10-01 VITALS — BP 169/74 | HR 76 | Temp 98.4°F | Resp 18 | Ht 60.0 in | Wt 89.0 lb

## 2020-10-01 DIAGNOSIS — R4189 Other symptoms and signs involving cognitive functions and awareness: Secondary | ICD-10-CM

## 2020-10-01 NOTE — Progress Notes (Signed)
Acute Office Visit  Subjective:    Patient ID: Michelle Jackson, female    DOB: [birth year], 75 y.o.   MRN: [id number]  Chief Complaint  Patient presents with   Acute Visit    Pt would like referral to dementia specialist as she has been forgetting things she also has a sinus infection that started 09-26-20 was put on amoxicillan from the urgent care she thinks she was tested for covid and it was negative went to urgent care on [address]     HPI Patient is in today for memory issues. We completed MMSE on 07/18/20, and it was 28/30.  She would like neuro referral since her memory issues are continuing. She states that she had a previous memoery battery that was 45 minutes long, and she was dissatisfied that the MMSE didn't take that long.  Past Medical History:  Diagnosis Date   GERD (gastroesophageal reflux disease)    Hypertension    Neuromuscular disorder ([place])    femoral nerve severed during hernia surgery; wears right knee brace    Past Surgical History:  Procedure Laterality Date   HERNIA REPAIR      History reviewed. No pertinent family history.  Social History   Socioeconomic History   Marital status: Unknown    Spouse name: Not on file   Number of children: Not on file   Years of education: Not on file   Highest education level: Not on file  Occupational History   Not on file  Tobacco Use   Smoking status: Never   Smokeless tobacco: Never  Substance and Sexual Activity   Alcohol use: Yes    Alcohol/week: 1.0 standard drink    Types: 1 Glasses of wine per week    Comment: occaisional    Drug use: Never   Sexual activity: Yes    Birth control/protection: Post-menopausal  Other Topics Concern   Not on file  Social History Narrative   Not on file   Social Determinants of Health   Financial Resource Strain: Not on file  Food Insecurity: Not on file  Transportation Needs: Not on file  Physical Activity: Not on file  Stress: Not on file  Social Connections:  Not on file  Intimate Partner Violence: Not on file    Outpatient Medications Prior to Visit  Medication Sig Dispense Refill   amLODipine (NORVASC) 5 MG tablet Take 5 mg by mouth daily.     amoxicillin (AMOXIL) 500 MG capsule Take 1,500 mg by mouth 2 (two) times daily.     ascorbic acid (VITAMIN C) 500 MG tablet Take 500 mg by mouth daily.     calcium-vitamin D 250-100 MG-UNIT tablet Take 1 tablet by mouth 2 (two) times daily.     cholecalciferol (VITAMIN D3) 25 MCG (1000 UNIT) tablet Take 1,000 Units by mouth daily.     ferrous sulfate 325 (65 FE) MG tablet Take 65 mg by mouth daily with breakfast.     pantoprazole (PROTONIX) 40 MG tablet Take 40 mg by mouth 2 (two) times daily.     predniSONE (STERAPRED UNI-PAK 21 TAB) 5 MG (21) TBPK tablet TAKE 6 TABLETS ON DAY 1 AS DIRECTED ON PACKAGE AND DECREASE BY 1 TAB EACH DAY FOR A TOTAL OF 6 DAYS     valsartan (DIOVAN) 320 MG tablet Take 320 mg by mouth daily.     No facility-administered medications prior to visit.    Allergies  Allergen Reactions   Iodinated Diagnostic Agents Hives  Other Hives    IVP Dye IVP Dye    Tape Other (See Comments)    Pt states she does better with paper tape    Review of Systems  Constitutional: Negative.   Respiratory: Negative.    Cardiovascular: Negative.   Psychiatric/Behavioral:  Negative for self-injury and suicidal ideas.        Memory loss      Objective:    Physical Exam Constitutional:      Appearance: Normal appearance.  Cardiovascular:     Rate and Rhythm: Normal rate and regular rhythm.     Pulses: Normal pulses.     Heart sounds: Normal heart sounds.  Pulmonary:     Effort: Pulmonary effort is normal.     Breath sounds: Normal breath sounds.  Neurological:     Mental Status: She is alert.  Psychiatric:     Comments: Anxious affect    BP (!) 169/74 (BP Location: Right Arm, Patient Position: Sitting, Cuff Size: Normal)   Pulse 76   Temp 98.4 F (36.9 C) (Oral)   Resp 18    Ht 5' (1.524 m)   Wt 89 lb (40.4 kg)   LMP  (LMP Unknown)   SpO2 98%   BMI 17.38 kg/m  Wt Readings from Last 3 Encounters:  10/01/20 89 lb (40.4 kg)  09/11/20 90 lb (40.8 kg)  07/18/20 89 lb (40.4 kg)    Health Maintenance Due  Topic Date Due   Hepatitis C Screening  Never done   COLONOSCOPY (Pts 45-74yrs Insurance coverage will need to be confirmed)  Never done   MAMMOGRAM  Never done   DEXA SCAN  Never done    There are no preventive care reminders to display for this patient.   Lab Results  Component Value Date   TSH 1.570 07/18/2020   Lab Results  Component Value Date   WBC 4.9 07/18/2020   HGB 14.0 07/18/2020   HCT 42.0 07/18/2020   MCV 96 07/18/2020   PLT 244 07/18/2020   Lab Results  Component Value Date   NA 140 07/18/2020   K 4.0 07/18/2020   CO2 25 07/18/2020   GLUCOSE 108 (H) 07/18/2020   BUN 14 07/18/2020   CREATININE 0.77 07/18/2020   BILITOT 0.7 07/18/2020   ALKPHOS 49 07/18/2020   AST 23 07/18/2020   ALT 15 07/18/2020   PROT 6.7 07/18/2020   ALBUMIN 4.7 07/18/2020   CALCIUM 9.7 07/24/2020   EGFR 81 07/18/2020   Lab Results  Component Value Date   CHOL 219 (H) 07/18/2020   Lab Results  Component Value Date   HDL 85 07/18/2020   Lab Results  Component Value Date   LDLCALC 114 (H) 07/18/2020   Lab Results  Component Value Date   TRIG 116 07/18/2020   No results found for: CHOLHDL No results found for: HGBA1C     Assessment & Plan:   Problem List Items Addressed This Visit       Other   Cognitive change - Primary    -previous MMSE was 28/30 -she would like a more extensive neurological battery -referral to neuro       Relevant Orders   Ambulatory referral to Neurology     No orders of the defined types were placed in this encounter.    Noreene Larsson, NP

## 2020-10-01 NOTE — Assessment & Plan Note (Signed)
-  previous MMSE was 28/30 -she would like a more extensive neurological battery -referral to neuro

## 2020-10-17 ENCOUNTER — Other Ambulatory Visit (HOSPITAL_COMMUNITY): Payer: Self-pay | Admitting: Neurology

## 2020-10-17 ENCOUNTER — Other Ambulatory Visit: Payer: Self-pay | Admitting: Neurology

## 2020-10-17 DIAGNOSIS — G3184 Mild cognitive impairment, so stated: Secondary | ICD-10-CM | POA: Diagnosis not present

## 2020-10-17 DIAGNOSIS — R413 Other amnesia: Secondary | ICD-10-CM

## 2020-10-25 ENCOUNTER — Other Ambulatory Visit: Payer: Self-pay

## 2020-10-25 ENCOUNTER — Ambulatory Visit (HOSPITAL_COMMUNITY)
Admission: RE | Admit: 2020-10-25 | Discharge: 2020-10-25 | Disposition: A | Discharge status: Home / Self Care | Payer: PPO | Source: Ambulatory Visit | Attending: Neurology | Admitting: Neurology

## 2020-10-25 DIAGNOSIS — R413 Other amnesia: Secondary | ICD-10-CM | POA: Diagnosis not present

## 2020-10-25 DIAGNOSIS — G319 Degenerative disease of nervous system, unspecified: Secondary | ICD-10-CM | POA: Diagnosis not present

## 2020-11-06 DIAGNOSIS — R413 Other amnesia: Secondary | ICD-10-CM | POA: Diagnosis not present

## 2020-11-08 DIAGNOSIS — H01139 Eczematous dermatitis of unspecified eye, unspecified eyelid: Secondary | ICD-10-CM | POA: Diagnosis not present

## 2020-11-12 DIAGNOSIS — M25562 Pain in left knee: Secondary | ICD-10-CM | POA: Diagnosis not present

## 2020-11-12 DIAGNOSIS — M25561 Pain in right knee: Secondary | ICD-10-CM | POA: Diagnosis not present

## 2020-11-20 DIAGNOSIS — R413 Other amnesia: Secondary | ICD-10-CM | POA: Diagnosis not present

## 2020-11-29 ENCOUNTER — Telehealth: Payer: PPO | Admitting: Nurse Practitioner

## 2020-12-05 ENCOUNTER — Ambulatory Visit (INDEPENDENT_AMBULATORY_CARE_PROVIDER_SITE_OTHER): Payer: PPO | Admitting: Nurse Practitioner

## 2020-12-05 ENCOUNTER — Other Ambulatory Visit: Payer: Self-pay

## 2020-12-05 ENCOUNTER — Encounter: Payer: Self-pay | Admitting: Internal Medicine

## 2020-12-05 ENCOUNTER — Encounter: Payer: Self-pay | Admitting: Nurse Practitioner

## 2020-12-05 VITALS — Ht 60.0 in | Wt 85.0 lb

## 2020-12-05 DIAGNOSIS — R152 Fecal urgency: Secondary | ICD-10-CM

## 2020-12-05 NOTE — Progress Notes (Signed)
Acute Office Visit  Subjective:    Patient ID: Michelle Jackson, female    DOB: 04-16-1945, 75 y.o.   MRN: [id number]  Chief Complaint  Patient presents with   Diarrhea    Ongoing x4 days, occurs right after eating. Having some lower back pain but does not feel bad otherwise.     Diarrhea  Associated symptoms include abdominal pain. Pertinent negatives include no chills, fever or vomiting.  Patient is in today for sick visit. She states that after she eats, she has fecal urgency. She states that she has normal stools, not liquid stools/diarrhea. She has taken pepto-bismol and anti-diarrheal (loperamide).  Past Medical History:  Diagnosis Date   GERD (gastroesophageal reflux disease)    Hypertension    Neuromuscular disorder ([place])    femoral nerve severed during hernia surgery; wears right knee brace    Past Surgical History:  Procedure Laterality Date   HERNIA REPAIR      History reviewed. No pertinent family history.  Social History   Socioeconomic History   Marital status: Unknown    Spouse name: Not on file   Number of children: Not on file   Years of education: Not on file   Highest education level: Not on file  Occupational History   Not on file  Tobacco Use   Smoking status: Never   Smokeless tobacco: Never  Substance and Sexual Activity   Alcohol use: Yes    Alcohol/week: 1.0 standard drink    Types: 1 Glasses of wine per week    Comment: occaisional    Drug use: Never   Sexual activity: Yes    Birth control/protection: Post-menopausal  Other Topics Concern   Not on file  Social History Narrative   Not on file   Social Determinants of Health   Financial Resource Strain: Not on file  Food Insecurity: Not on file  Transportation Needs: Not on file  Physical Activity: Not on file  Stress: Not on file  Social Connections: Not on file  Intimate Partner Violence: Not on file    Outpatient Medications Prior to Visit  Medication Sig Dispense Refill    amLODipine (NORVASC) 5 MG tablet Take 5 mg by mouth daily.     ascorbic acid (VITAMIN C) 500 MG tablet Take 500 mg by mouth daily.     calcium-vitamin D 250-100 MG-UNIT tablet Take 1 tablet by mouth 2 (two) times daily.     cholecalciferol (VITAMIN D3) 25 MCG (1000 UNIT) tablet Take 1,000 Units by mouth daily.     ferrous sulfate 325 (65 FE) MG tablet Take 65 mg by mouth daily with breakfast.     pantoprazole (PROTONIX) 40 MG tablet Take 40 mg by mouth 2 (two) times daily.     valsartan (DIOVAN) 320 MG tablet Take 320 mg by mouth daily.     amoxicillin (AMOXIL) 500 MG capsule Take 1,500 mg by mouth 2 (two) times daily. (Patient not taking: Reported on 12/05/2020)     predniSONE (STERAPRED UNI-PAK 21 TAB) 5 MG (21) TBPK tablet TAKE 6 TABLETS ON DAY 1 AS DIRECTED ON PACKAGE AND DECREASE BY 1 TAB EACH DAY FOR A TOTAL OF 6 DAYS (Patient not taking: Reported on 12/05/2020)     No facility-administered medications prior to visit.    Allergies  Allergen Reactions   Iodinated Diagnostic Agents Hives   Other Hives    IVP Dye IVP Dye    Tape Other (See Comments)    Pt states she does  better with paper tape    Review of Systems  Constitutional:  Negative for chills, fatigue and fever.  Gastrointestinal:  Positive for abdominal pain. Negative for abdominal distention, blood in stool, constipation, diarrhea, nausea and vomiting.       Pain below her waist, and that feels better with heating pad.      Objective:    Physical Exam  Ht 5' (1.524 m)   Wt 85 lb (38.6 kg)   LMP  (LMP Unknown)   BMI 16.60 kg/m  Wt Readings from Last 3 Encounters:  12/05/20 85 lb (38.6 kg)  10/01/20 89 lb (40.4 kg)  09/11/20 90 lb (40.8 kg)    Health Maintenance Due  Topic Date Due   Hepatitis C Screening  Never done   COLONOSCOPY (Pts 45-85yrs Insurance coverage will need to be confirmed)  Never done   MAMMOGRAM  Never done   DEXA SCAN  Never done   INFLUENZA VACCINE  10/22/2020   COVID-19 Vaccine (5 -  Booster for Moderna series) 10/24/2020    There are no preventive care reminders to display for this patient.   Lab Results  Component Value Date   TSH 1.570 07/18/2020   Lab Results  Component Value Date   WBC 4.9 07/18/2020   HGB 14.0 07/18/2020   HCT 42.0 07/18/2020   MCV 96 07/18/2020   PLT 244 07/18/2020   Lab Results  Component Value Date   NA 140 07/18/2020   K 4.0 07/18/2020   CO2 25 07/18/2020   GLUCOSE 108 (H) 07/18/2020   BUN 14 07/18/2020   CREATININE 0.77 07/18/2020   BILITOT 0.7 07/18/2020   ALKPHOS 49 07/18/2020   AST 23 07/18/2020   ALT 15 07/18/2020   PROT 6.7 07/18/2020   ALBUMIN 4.7 07/18/2020   CALCIUM 9.7 07/24/2020   EGFR 81 07/18/2020   Lab Results  Component Value Date   CHOL 219 (H) 07/18/2020   Lab Results  Component Value Date   HDL 85 07/18/2020   Lab Results  Component Value Date   LDLCALC 114 (H) 07/18/2020   Lab Results  Component Value Date   TRIG 116 07/18/2020   No results found for: CHOLHDL No results found for: HGBA1C     Assessment & Plan:   Problem List Items Addressed This Visit       Other   Fecal urgency - Primary   Relevant Orders   Ambulatory referral to Gastroenterology     No orders of the defined types were placed in this encounter.  Date:  12/05/2020   Location of Patient: Home Location of Provider: Office Consent was obtain for visit to be over via telehealth. I verified that I am speaking with the correct person using two identifiers.  I connected with  Michelle Jackson on 12/05/20 via telephone and verified that I am speaking with the correct person using two identifiers.   I discussed the limitations of evaluation and management by telemedicine. The patient expressed understanding and agreed to proceed.  Time spent: 9 min   Noreene Larsson, NP

## 2020-12-05 NOTE — Assessment & Plan Note (Signed)
-  no diarrhea or constipation; just has post-meal urgency x 4 days -no abdominal pain -has been taking imodium and pepto-bismol without relief -no s/s of infection -unsure of etiology; suspect this will be self-limiting, but referral to GI in case this continues -would consider abd imaging (?U/S) if this persists

## 2020-12-13 DIAGNOSIS — G3184 Mild cognitive impairment, so stated: Secondary | ICD-10-CM | POA: Diagnosis not present

## 2020-12-17 ENCOUNTER — Ambulatory Visit: Payer: PPO | Admitting: Nurse Practitioner

## 2020-12-18 DIAGNOSIS — M533 Sacrococcygeal disorders, not elsewhere classified: Secondary | ICD-10-CM | POA: Diagnosis not present

## 2020-12-19 ENCOUNTER — Telehealth: Payer: Self-pay | Admitting: Nurse Practitioner

## 2020-12-19 NOTE — Telephone Encounter (Signed)
  Left message for patient to call back and schedule Medicare Annual Wellness Visit (AWV) in office.   If unable to come into the office for AWV,  please offer to do virtually or by telephone.  No hx of AWV eligible for AWVI as of 11/22/2012  Please schedule at anytime with [facility].      40 Minutes appointment   Any questions, please call me at [phone]

## 2020-12-26 ENCOUNTER — Ambulatory Visit: Payer: PPO

## 2021-01-09 ENCOUNTER — Ambulatory Visit (INDEPENDENT_AMBULATORY_CARE_PROVIDER_SITE_OTHER): Payer: PPO

## 2021-01-09 ENCOUNTER — Other Ambulatory Visit: Payer: Self-pay

## 2021-01-09 VITALS — Ht 60.0 in | Wt 85.0 lb

## 2021-01-09 DIAGNOSIS — Z0000 Encounter for general adult medical examination without abnormal findings: Secondary | ICD-10-CM | POA: Diagnosis not present

## 2021-01-09 NOTE — Patient Instructions (Signed)
Michelle Jackson , Thank you for taking time to come for your Medicare Wellness Visit. I appreciate your ongoing commitment to your health goals. Please review the following plan we discussed and let me know if I can assist you in the future.   Screening recommendations/referrals: Colonoscopy: Done, awaiting records Mammogram: Done, awaiting records Bone Density: Done, awaiting records Recommended yearly ophthalmology/optometry visit for glaucoma screening and checkup Recommended yearly dental visit for hygiene and checkup  Vaccinations: Influenza vaccine: 12/02/20 Pneumococcal vaccine: 12/09/13 and 01/21/17 Tdap vaccine: Repeat in 10 years  Shingles vaccine: 11/16/07, 06/02/17 and 02/14/19   Covid-19:05/14/19, 06/11/19, 01/13/20, 06/24/20  Advanced directives: Advance directive discussed with you today. Even though you declined this today, please call our office should you change your mind, and we can give you the proper paperwork for you to fill out.   Conditions/risks identified: Aim for 30 minutes of exercise or brisk walking each day, drink 6-8 glasses of water and eat lots of fruits and vegetables.   Next appointment: Follow up in one year for your annual wellness visit 2023.   Preventive Care 74 Years and Older, Female Preventive care refers to lifestyle choices and visits with your health care provider that can promote health and wellness. What does preventive care include? A yearly physical exam. This is also called an annual well check. Dental exams once or twice a year. Routine eye exams. Ask your health care provider how often you should have your eyes checked. Personal lifestyle choices, including: Daily care of your teeth and gums. Regular physical activity. Eating a healthy diet. Avoiding tobacco and drug use. Limiting alcohol use. Practicing safe sex. Taking low-dose aspirin every day. Taking vitamin and mineral supplements as recommended by your health care provider. What  happens during an annual well check? The services and screenings done by your health care provider during your annual well check will depend on your age, overall health, lifestyle risk factors, and family history of disease. Counseling  Your health care provider may ask you questions about your: Alcohol use. Tobacco use. Drug use. Emotional well-being. Home and relationship well-being. Sexual activity. Eating habits. History of falls. Memory and ability to understand (cognition). Work and work Statistician. Reproductive health. Screening  You may have the following tests or measurements: Height, weight, and BMI. Blood pressure. Lipid and cholesterol levels. These may be checked every 5 years, or more frequently if you are over 26 years old. Skin check. Lung cancer screening. You may have this screening every year starting at age 65 if you have a 30-pack-year history of smoking and currently smoke or have quit within the past 15 years. Fecal occult blood test (FOBT) of the stool. You may have this test every year starting at age 37. Flexible sigmoidoscopy or colonoscopy. You may have a sigmoidoscopy every 5 years or a colonoscopy every 10 years starting at age 6. Hepatitis C blood test. Hepatitis B blood test. Sexually transmitted disease (STD) testing. Diabetes screening. This is done by checking your blood sugar (glucose) after you have not eaten for a while (fasting). You may have this done every 1-3 years. Bone density scan. This is done to screen for osteoporosis. You may have this done starting at age 62. Mammogram. This may be done every 1-2 years. Talk to your health care provider about how often you should have regular mammograms. Talk with your health care provider about your test results, treatment options, and if necessary, the need for more tests. Vaccines  Your health care  provider may recommend certain vaccines, such as: Influenza vaccine. This is recommended every  year. Tetanus, diphtheria, and acellular pertussis (Tdap, Td) vaccine. You may need a Td booster every 10 years. Zoster vaccine. You may need this after age 48. Pneumococcal 13-valent conjugate (PCV13) vaccine. One dose is recommended after age 23. Pneumococcal polysaccharide (PPSV23) vaccine. One dose is recommended after age 65. Talk to your health care provider about which screenings and vaccines you need and how often you need them. This information is not intended to replace advice given to you by your health care provider. Make sure you discuss any questions you have with your health care provider. Document Released: 04/06/2015 Document Revised: 11/28/2015 Document Reviewed: 01/09/2015 Elsevier Interactive Patient Education  2017 [place] Prevention in the Home Falls can cause injuries. They can happen to people of all ages. There are many things you can do to make your home safe and to help prevent falls. What can I do on the outside of my home? Regularly fix the edges of walkways and driveways and fix any cracks. Remove anything that might make you trip as you walk through a door, such as a raised step or threshold. Trim any bushes or trees on the path to your home. Use bright outdoor lighting. Clear any walking paths of anything that might make someone trip, such as rocks or tools. Regularly check to see if handrails are loose or broken. Make sure that both sides of any steps have handrails. Any raised decks and porches should have guardrails on the edges. Have any leaves, snow, or ice cleared regularly. Use sand or salt on walking paths during winter. Clean up any spills in your garage right away. This includes oil or grease spills. What can I do in the bathroom? Use night lights. Install grab bars by the toilet and in the tub and shower. Do not use towel bars as grab bars. Use non-skid mats or decals in the tub or shower. If you need to sit down in the shower, use a  plastic, non-slip stool. Keep the floor dry. Clean up any water that spills on the floor as soon as it happens. Remove soap buildup in the tub or shower regularly. Attach bath mats securely with double-sided non-slip rug tape. Do not have throw rugs and other things on the floor that can make you trip. What can I do in the bedroom? Use night lights. Make sure that you have a light by your bed that is easy to reach. Do not use any sheets or blankets that are too big for your bed. They should not hang down onto the floor. Have a firm chair that has side arms. You can use this for support while you get dressed. Do not have throw rugs and other things on the floor that can make you trip. What can I do in the kitchen? Clean up any spills right away. Avoid walking on wet floors. Keep items that you use a lot in easy-to-reach places. If you need to reach something above you, use a strong step stool that has a grab bar. Keep electrical cords out of the way. Do not use floor polish or wax that makes floors slippery. If you must use wax, use non-skid floor wax. Do not have throw rugs and other things on the floor that can make you trip. What can I do with my stairs? Do not leave any items on the stairs. Make sure that there are handrails on both  sides of the stairs and use them. Fix handrails that are broken or loose. Make sure that handrails are as long as the stairways. Check any carpeting to make sure that it is firmly attached to the stairs. Fix any carpet that is loose or worn. Avoid having throw rugs at the top or bottom of the stairs. If you do have throw rugs, attach them to the floor with carpet tape. Make sure that you have a light switch at the top of the stairs and the bottom of the stairs. If you do not have them, ask someone to add them for you. What else can I do to help prevent falls? Wear shoes that: Do not have high heels. Have rubber bottoms. Are comfortable and fit you  well. Are closed at the toe. Do not wear sandals. If you use a stepladder: Make sure that it is fully opened. Do not climb a closed stepladder. Make sure that both sides of the stepladder are locked into place. Ask someone to hold it for you, if possible. Clearly mark and make sure that you can see: Any grab bars or handrails. First and last steps. Where the edge of each step is. Use tools that help you move around (mobility aids) if they are needed. These include: Canes. Walkers. Scooters. Crutches. Turn on the lights when you go into a dark area. Replace any light bulbs as soon as they burn out. Set up your furniture so you have a clear path. Avoid moving your furniture around. If any of your floors are uneven, fix them. If there are any pets around you, be aware of where they are. Review your medicines with your doctor. Some medicines can make you feel dizzy. This can increase your chance of falling. Ask your doctor what other things that you can do to help prevent falls. This information is not intended to replace advice given to you by your health care provider. Make sure you discuss any questions you have with your health care provider. Document Released: 01/04/2009 Document Revised: 08/16/2015 Document Reviewed: 04/14/2014 Elsevier Interactive Patient Education  2017 [employer].

## 2021-01-09 NOTE — Progress Notes (Signed)
Subjective:   Michelle Jackson is a 75 y.o. female who presents for an Initial Medicare Annual Wellness Visit. Virtual Visit via Telephone Note  I connected with  Michelle Jackson on 01/09/21 at  3:00 PM EDT by telephone and verified that I am speaking with the correct person using two identifiers.  Location: Patient: Home Provider: [facility] Persons participating in the virtual visit: patient/Nurse Health Advisor   I discussed the limitations, risks, security and privacy concerns of performing an evaluation and management service by telephone and the availability of in person appointments. The patient expressed understanding and agreed to proceed.  Interactive audio and video telecommunications were attempted between this nurse and patient, however failed, due to patient having technical difficulties OR patient did not have access to video capability.  We continued and completed visit with audio only.  Some vital signs may be absent or patient reported.   [name], LPN  Review of Systems     Cardiac Risk Factors include: advanced age (>71men, >105 women);hypertension;sedentary lifestyle     Objective:    Today's Vitals   01/09/21 1442 01/09/21 1448  Weight: 85 lb (38.6 kg)   Height: 5' (1.524 m)   PainSc:  5    Body mass index is 16.6 kg/m.  Advanced Directives 01/09/2021  Does Patient Have a Medical Advance Directive? No  Would patient like information on creating a medical advance directive? No - Patient declined    Current Medications (verified) Outpatient Encounter Medications as of 01/09/2021  Medication Sig   amLODipine (NORVASC) 5 MG tablet Take 5 mg by mouth daily.   ascorbic acid (VITAMIN C) 500 MG tablet Take 500 mg by mouth daily.   calcium-vitamin D 250-100 MG-UNIT tablet Take 1 tablet by mouth 2 (two) times daily.   cholecalciferol (VITAMIN D3) 25 MCG (1000 UNIT) tablet Take 1,000 Units by mouth daily.   desonide (DESOWEN) 0.05 % lotion SMARTSIG:Sparingly  Topical 2-3 Times Daily PRN   donepezil (ARICEPT) 5 MG tablet Take 5 mg by mouth at bedtime.   ferrous sulfate 325 (65 FE) MG tablet Take 65 mg by mouth daily with breakfast.   FLUAD QUADRIVALENT 0.5 ML injection    pantoprazole (PROTONIX) 40 MG tablet Take 40 mg by mouth 2 (two) times daily.   valsartan (DIOVAN) 320 MG tablet Take 320 mg by mouth daily.   No facility-administered encounter medications on file as of 01/09/2021.    Allergies (verified) Iodinated diagnostic agents, Other, and Tape   History: Past Medical History:  Diagnosis Date   GERD (gastroesophageal reflux disease)    Hypertension    Neuromuscular disorder ([place])    femoral nerve severed during hernia surgery; wears right knee brace   Past Surgical History:  Procedure Laterality Date   HERNIA REPAIR     No family history on file. Social History   Socioeconomic History   Marital status: Unknown    Spouse name: Not on file   Number of children: 0   Years of education: Not on file   Highest education level: Not on file  Occupational History   Not on file  Tobacco Use   Smoking status: Never   Smokeless tobacco: Never  Substance and Sexual Activity   Alcohol use: Yes    Alcohol/week: 1.0 standard drink    Types: 1 Glasses of wine per week    Comment: occaisional    Drug use: Never   Sexual activity: Yes    Birth control/protection: Post-menopausal  Other  Topics Concern   Not on file  Social History Narrative   Not on file   Social Determinants of Health   Financial Resource Strain: Low Risk    Difficulty of Paying Living Expenses: Not hard at all  Food Insecurity: No Food Insecurity   Worried About Charity fundraiser in the Last Year: Never true   [place] in the Last Year: Never true  Transportation Needs: No Transportation Needs   Lack of Transportation (Medical): No   Lack of Transportation (Non-Medical): No  Physical Activity: Inactive   Days of Exercise per Week: 0 days    Minutes of Exercise per Session: 0 min  Stress: No Stress Concern Present   Feeling of Stress : Not at all  Social Connections: Moderately Isolated   Frequency of Communication with Friends and Family: More than three times a week   Frequency of Social Gatherings with Friends and Family: More than three times a week   Attends Religious Services: Never   Marine scientist or Organizations: Yes   Attends Archivist Meetings: Never   Marital Status: Divorced    Tobacco Counseling Counseling given: Not Answered   Clinical Intake:  Pre-visit preparation completed: Yes  Pain : 0-10 Pain Score: 5  Pain Type: Chronic pain Pain Location: Back (Has hairline fracture of lower back.) Pain Descriptors / Indicators: Aching Pain Onset: More than a month ago Pain Frequency: Intermittent     BMI - recorded: 16.6 Nutritional Status: BMI <19  Underweight Nutritional Risks: Unintentional weight loss (Currently on Donazapril and it causes pt to be nauseated. Pt is currently drinking Boost.) Diabetes: No  How often do you need to have someone help you when you read instructions, pamphlets, or other written materials from your doctor or pharmacy?: 1 - Never  Diabetic?NO  Interpreter Needed?: No  Information entered by :: [name], LPN   Activities of Daily Living In your present state of health, do you have any difficulty performing the following activities: 01/09/2021 10/01/2020  Hearing? N N  Vision? N N  Difficulty concentrating or making decisions? Y N  Comment Currently on Donazapril. -  Walking or climbing stairs? N N  Dressing or bathing? N N  Doing errands, shopping? N N  Preparing Food and eating ? N -  Using the Toilet? N -  In the past six months, have you accidently leaked urine? Y -  Comment sometimes -  Do you have problems with loss of bowel control? N -  Managing your Medications? N -  Managing your Finances? N -  Housekeeping or managing your  Housekeeping? N -  Some recent data might be hidden    Patient Care Team: Noreene Larsson, NP as PCP - General (Nurse Practitioner)  Indicate any recent Medical Services you may have received from other than [facility] providers in the past year (date may be approximate).     Assessment:   This is a routine wellness examination for Michelle Jackson.  Hearing/Vision screen Hearing Screening - Comments:: Some hearing issues.  Vision Screening - Comments:: Readers/Glasses for distance. Needs Eye MD, is checking to see who Insurance.   Dietary issues and exercise activities discussed: Current Exercise Habits: The patient does not participate in regular exercise at present, Exercise limited by: cardiac condition(s);orthopedic condition(s)   Goals Addressed             This Visit's Progress    Have 3 meals a day  Depression Screen PHQ 2/9 Scores 01/09/2021 12/05/2020 10/01/2020 09/11/2020 07/18/2020 06/13/2020  PHQ - 2 Score 0 1 0 0 3 1  PHQ- 9 Score - - - 2 8 -    Fall Risk Fall Risk  01/09/2021 12/05/2020 10/01/2020 09/11/2020 07/18/2020  Falls in the past year? 0 0 0 0 0  Number falls in past yr: 0 0 0 0 0  Injury with Fall? 0 0 0 0 0  Risk for fall due to : Impaired mobility No Fall Risks No Fall Risks No Fall Risks No Fall Risks  Follow up Falls prevention discussed Falls evaluation completed Falls evaluation completed Falls evaluation completed Falls evaluation completed    [place]:  Any stairs in or around the home? No  If so, are there any without handrails? No  Home free of loose throw rugs in walkways, pet beds, electrical cords, etc? Yes  Adequate lighting in your home to reduce risk of falls? Yes   ASSISTIVE DEVICES UTILIZED TO PREVENT FALLS:  Life alert? No  Use of a cane, walker or w/c? Yes  Grab bars in the bathroom? Yes  Shower chair or bench in shower? Yes  Elevated toilet seat or a handicapped toilet? No   TIMED UP AND GO:  Was  the test performed? No . Phone visit.   Cognitive Function:Cognitive status assessed by direct observation. 12/14/2020, [facility]. Patient has current diagnosis of cognitive impairment. Yes Patient is followed by neurology for ongoing assessment. Yes Patient declines 6CIT or MMSE.   MMSE - Mini Mental State Exam 07/18/2020  Not completed: (No Data)  Orientation to time 4  Orientation to Place 5  Registration 3  Attention/ Calculation 5  Recall 2  Language- name 2 objects 2  Language- repeat 1  Language- follow 3 step command 3  Language- read & follow direction 1  Write a sentence 1  Copy design 1  Total score 28        Immunizations Immunization History  Administered Date(s) Administered   Fluad Quad(high Dose 65+) 11/29/2015, 12/20/2017, 12/02/2020   Influenza Split 12/29/2011, 11/25/2013   Influenza, High Dose Seasonal PF 12/05/2012, 12/12/2014, 12/01/2016, 12/20/2017, 12/20/2018, 11/23/2019   Influenza,inj,quad, With Preservative 12/05/2012   Influenza-Unspecified 01/08/2011   Moderna Covid-19 Vaccine Bivalent Booster 57yrs & up 12/21/2020   Moderna Sars-Covid-2 Vaccination 05/14/2019, 06/11/2019, 01/13/2020, 06/24/2020   Pneumococcal Conjugate-13 12/09/2013   Pneumococcal Polysaccharide-23 04/14/2012, 01/21/2017   Tdap 08/17/2003, 11/16/2015   Zoster Recombinat (Shingrix) 06/02/2017, 02/14/2019   Zoster, Live 11/16/2007    TDAP status: Up to date  Flu Vaccine status: Up to date  Pneumococcal vaccine status: Up to date  Covid-19 vaccine status: Completed vaccines  Qualifies for Shingles Vaccine? Yes   Zostavax completed Yes   Shingrix Completed?: No.    Education has been provided regarding the importance of this vaccine. Patient has been advised to call insurance company to determine out of pocket expense if they have not yet received this vaccine. Advised may also receive vaccine at local pharmacy or Health Dept. Verbalized acceptance and  understanding.  Screening Tests Health Maintenance  Topic Date Due   Hepatitis C Screening  Never done   COLONOSCOPY (Pts 45-66yrs Insurance coverage will need to be confirmed)  Never done   MAMMOGRAM  Never done   DEXA SCAN  Never done   COVID-19 Vaccine (5 - Booster for Moderna series) 10/24/2020   TETANUS/TDAP  11/15/2025   Pneumonia Vaccine 15+ Years old  Completed   INFLUENZA VACCINE  Completed   Zoster Vaccines- Shingrix  Completed   HPV VACCINES  Aged Out    Health Maintenance  Health Maintenance Due  Topic Date Due   Hepatitis C Screening  Never done   COLONOSCOPY (Pts 45-94yrs Insurance coverage will need to be confirmed)  Never done   MAMMOGRAM  Never done   DEXA SCAN  Never done   COVID-19 Vaccine (5 - Booster for Moderna series) 10/24/2020    Colorectal cancer screening: Type of screening: Colonoscopy. Completed 2020 per pt. Repeat every 10 years  Mammogram status: Completed 2021 per pt. Repeat every year  Bone Density status: Completed 2019 per pt. Results reflect: Bone density results: OSTEOPOROSIS. Repeat every 2 years.  Lung Cancer Screening: (Low Dose CT Chest recommended if Age 74-80 years, 30 pack-year currently smoking OR have quit w/in 15years.) does not qualify.     Additional Screening:  Hepatitis C Screening: does qualify; Completed NOT DONE   Vision Screening: Recommended annual ophthalmology exams for early detection of glaucoma and other disorders of the eye. Is the patient up to date with their annual eye exam?  No  Who is the provider or what is the name of the office in which the patient attends annual eye exams? None at this time. If pt is not established with a provider, would they like to be referred to a provider to establish care? No .   Dental Screening: Recommended annual dental exams for proper oral hygiene  Community Resource Referral / Chronic Care Management: CRR required this visit?  No   CCM required this visit?  No       Plan:     I have personally reviewed and noted the following in the patient's chart:   Medical and social history Use of alcohol, tobacco or illicit drugs  Current medications and supplements including opioid prescriptions. Patient is not currently taking opioid prescriptions. Functional ability and status Nutritional status Physical activity Advanced directives List of other physicians Hospitalizations, surgeries, and ER visits in previous 12 months Vitals Screenings to include cognitive, depression, and falls Referrals and appointments  In addition, I have reviewed and discussed with patient certain preventive protocols, quality metrics, and best practice recommendations. A written personalized care plan for preventive services as well as general preventive health recommendations were provided to patient.     [name], LPN   [id number]   Nurse Notes: Pt states she is doing well. Records from previous MD have been requested. Pt would like to wait until her records have been received to scheduled Health Maintenance exams. Pt declined 6CIT due to recent dx of memory loss. Pt has started Donazapril as of 12/14/2020. Pt is also underweight, encouraged pt to eat 3 meals per day and include Boost or Ensure to diet. Pt is agreeable.

## 2021-01-11 ENCOUNTER — Encounter: Payer: Self-pay | Admitting: *Deleted

## 2021-01-17 ENCOUNTER — Ambulatory Visit: Payer: PPO | Admitting: Nurse Practitioner

## 2021-01-24 DIAGNOSIS — M170 Bilateral primary osteoarthritis of knee: Secondary | ICD-10-CM | POA: Diagnosis not present

## 2021-02-04 ENCOUNTER — Encounter: Payer: Self-pay | Admitting: *Deleted

## 2021-02-04 ENCOUNTER — Ambulatory Visit: Payer: PPO | Admitting: Nurse Practitioner

## 2021-02-28 ENCOUNTER — Other Ambulatory Visit: Payer: Self-pay

## 2021-02-28 ENCOUNTER — Encounter: Payer: Self-pay | Admitting: Nurse Practitioner

## 2021-02-28 ENCOUNTER — Ambulatory Visit (INDEPENDENT_AMBULATORY_CARE_PROVIDER_SITE_OTHER): Payer: PPO | Admitting: Nurse Practitioner

## 2021-02-28 VITALS — BP 130/70 | HR 67 | Resp 15 | Ht 60.0 in | Wt 81.4 lb

## 2021-02-28 DIAGNOSIS — J3489 Other specified disorders of nose and nasal sinuses: Secondary | ICD-10-CM | POA: Diagnosis not present

## 2021-02-28 DIAGNOSIS — I10 Essential (primary) hypertension: Secondary | ICD-10-CM

## 2021-02-28 DIAGNOSIS — D509 Iron deficiency anemia, unspecified: Secondary | ICD-10-CM | POA: Diagnosis not present

## 2021-02-28 MED ORDER — AZITHROMYCIN 250 MG PO TABS: ORAL_TABLET | ORAL | 0 refills | Status: DC

## 2021-02-28 MED ORDER — AZELASTINE HCL 0.1 % NA SOLN: NASAL | 12 refills | Status: DC

## 2021-02-28 NOTE — Progress Notes (Signed)
Acute Office Visit  Subjective:    Patient ID: Michelle Jackson, female    DOB: August 29, 1945, 75 y.o.   MRN: [id number]  Chief Complaint  Patient presents with   chest congestion     X 1, started Nov 25, a lot of coughing and mucus, no fever but still draining down her throat and some congestion in chest     HPI Patient is in today for congestion. She got sick on Thanksgiving, and she is still having a runny nose and cough. She has tried taking OTC actifed and aspirin.  Past Medical History:  Diagnosis Date   GERD (gastroesophageal reflux disease)    Hypertension    Neuromuscular disorder ([place])    femoral nerve severed during hernia surgery; wears right knee brace    Past Surgical History:  Procedure Laterality Date   HERNIA REPAIR      History reviewed. No pertinent family history.  Social History   Socioeconomic History   Marital status: Divorced    Spouse name: Not on file   Number of children: 0   Years of education: Not on file   Highest education level: Not on file  Occupational History   Not on file  Tobacco Use   Smoking status: Never   Smokeless tobacco: Never  Substance and Sexual Activity   Alcohol use: Yes    Alcohol/week: 1.0 standard drink    Types: 1 Glasses of wine per week    Comment: occaisional    Drug use: Never   Sexual activity: Yes    Birth control/protection: Post-menopausal  Other Topics Concern   Not on file  Social History Narrative   Lives alone but pt states she has a current boyfriend and close friends and neighbors.   Social Determinants of Health   Financial Resource Strain: Low Risk    Difficulty of Paying Living Expenses: Not hard at all  Food Insecurity: No Food Insecurity   Worried About Charity fundraiser in the Last Year: Never true   [place] in the Last Year: Never true  Transportation Needs: No Transportation Needs   Lack of Transportation (Medical): No   Lack of Transportation (Non-Medical): No  Physical  Activity: Inactive   Days of Exercise per Week: 0 days   Minutes of Exercise per Session: 0 min  Stress: No Stress Concern Present   Feeling of Stress : Not at all  Social Connections: Moderately Isolated   Frequency of Communication with Friends and Family: More than three times a week   Frequency of Social Gatherings with Friends and Family: More than three times a week   Attends Religious Services: Never   Marine scientist or Organizations: Yes   Attends Archivist Meetings: Never   Marital Status: Divorced  Human resources officer Violence: Not At Risk   Fear of Current or Ex-Partner: No   Emotionally Abused: No   Physically Abused: No   Sexually Abused: No    Outpatient Medications Prior to Visit  Medication Sig Dispense Refill   amLODipine (NORVASC) 5 MG tablet Take 5 mg by mouth daily.     ascorbic acid (VITAMIN C) 500 MG tablet Take 500 mg by mouth daily.     calcium-vitamin D 250-100 MG-UNIT tablet Take 1 tablet by mouth 2 (two) times daily.     cholecalciferol (VITAMIN D3) 25 MCG (1000 UNIT) tablet Take 1,000 Units by mouth daily.     desonide (DESOWEN) 0.05 % lotion SMARTSIG:Sparingly Topical  2-3 Times Daily PRN     donepezil (ARICEPT) 5 MG tablet Take 5 mg by mouth at bedtime.     ferrous sulfate 325 (65 FE) MG tablet Take 65 mg by mouth daily with breakfast.     pantoprazole (PROTONIX) 40 MG tablet Take 40 mg by mouth 2 (two) times daily.     valsartan (DIOVAN) 320 MG tablet Take 320 mg by mouth daily.     FLUAD QUADRIVALENT 0.5 ML injection      No facility-administered medications prior to visit.    Allergies  Allergen Reactions   Iodinated Diagnostic Agents Hives   Other Hives    IVP Dye IVP Dye    Tape Other (See Comments)    Pt states she does better with paper tape    Review of Systems  Constitutional: Negative.   HENT:  Positive for congestion and rhinorrhea.   Respiratory: Negative.    Cardiovascular: Negative.       Objective:     Physical Exam  BP 130/70   Pulse 67   Resp 15   Ht 5' (1.524 m)   Wt 81 lb 6.4 oz (36.9 kg)   LMP  (LMP Unknown)   SpO2 94%   BMI 15.90 kg/m  Wt Readings from Last 3 Encounters:  02/28/21 81 lb 6.4 oz (36.9 kg)  01/09/21 85 lb (38.6 kg)  12/05/20 85 lb (38.6 kg)    Health Maintenance Due  Topic Date Due   Hepatitis C Screening  Never done   COLONOSCOPY (Pts 45-11yr Insurance coverage will need to be confirmed)  Never done    There are no preventive care reminders to display for this patient.   Lab Results  Component Value Date   TSH 1.570 07/18/2020   Lab Results  Component Value Date   WBC 4.9 07/18/2020   HGB 14.0 07/18/2020   HCT 42.0 07/18/2020   MCV 96 07/18/2020   PLT 244 07/18/2020   Lab Results  Component Value Date   NA 140 07/18/2020   K 4.0 07/18/2020   CO2 25 07/18/2020   GLUCOSE 108 (H) 07/18/2020   BUN 14 07/18/2020   CREATININE 0.77 07/18/2020   BILITOT 0.7 07/18/2020   ALKPHOS 49 07/18/2020   AST 23 07/18/2020   ALT 15 07/18/2020   PROT 6.7 07/18/2020   ALBUMIN 4.7 07/18/2020   CALCIUM 9.7 07/24/2020   EGFR 81 07/18/2020   Lab Results  Component Value Date   CHOL 219 (H) 07/18/2020   Lab Results  Component Value Date   HDL 85 07/18/2020   Lab Results  Component Value Date   LDLCALC 114 (H) 07/18/2020   Lab Results  Component Value Date   TRIG 116 07/18/2020   No results found for: CHOLHDL No results found for: HGBA1C     Assessment & Plan:   Problem List Items Addressed This Visit       Cardiovascular and Mediastinum   Hypertension, essential    -routine labs ordered      Relevant Orders   CBC with Differential/Platelet   CMP14+EGFR   Lipid Panel With LDL/HDL Ratio     Other   Iron deficiency anemia    -ordered iron panel with labs      Relevant Orders   CBC with Differential/Platelet   Iron, TIBC and Ferritin Panel   Rhinorrhea - Primary    -ongoing x 2 weeks; started 02/15/21 -Rx. z-pack and  azelastine       Relevant Medications  azithromycin (ZITHROMAX) 250 MG tablet   azelastine (ASTELIN) 0.1 % nasal spray     Meds ordered this encounter  Medications   azithromycin (ZITHROMAX) 250 MG tablet    Sig: Please dispense as a z-pack    Dispense:  6 tablet    Refill:  0   azelastine (ASTELIN) 0.1 % nasal spray    Sig: Use 2 sprays in each nostril every 12 hours for 1 week; After that, you may use 1 spray in each nostril twice a day as needed for allergies/congestion    Dispense:  30 mL    Refill:  Elida, NP

## 2021-02-28 NOTE — Assessment & Plan Note (Signed)
-  ordered iron panel with labs

## 2021-02-28 NOTE — Assessment & Plan Note (Signed)
-  routine labs ordered

## 2021-02-28 NOTE — Patient Instructions (Signed)
Please have fasting labs drawn 2-3 days prior to your appointment so we can discuss the results during your office visit.  

## 2021-02-28 NOTE — Assessment & Plan Note (Signed)
-  ongoing x 2 weeks; started 02/15/21 -Rx. z-pack and azelastine

## 2021-03-22 ENCOUNTER — Telehealth: Payer: Self-pay | Admitting: Nurse Practitioner

## 2021-03-22 ENCOUNTER — Other Ambulatory Visit: Payer: Self-pay

## 2021-03-22 MED ORDER — AMLODIPINE BESYLATE 5 MG PO TABS: 5.0000 mg | ORAL_TABLET | Freq: Every day | ORAL | 2 refills | Status: DC

## 2021-03-22 NOTE — Telephone Encounter (Signed)
Pt needs refill on   amLODipine (NORVASC) 5 MG tablet

## 2021-03-22 NOTE — Telephone Encounter (Signed)
Refills sent

## 2021-03-26 DIAGNOSIS — G3184 Mild cognitive impairment, so stated: Secondary | ICD-10-CM | POA: Diagnosis not present

## 2021-03-29 LAB — CBC WITH DIFFERENTIAL/PLATELET
Basophils Absolute: 0 10*3/uL (ref 0.0–0.2)
Basos: 1 %
EOS (ABSOLUTE): 0.1 10*3/uL (ref 0.0–0.4)
Eos: 2 %
Hematocrit: 39.3 % (ref 34.0–46.6)
Hemoglobin: 13.2 g/dL (ref 11.1–15.9)
Immature Grans (Abs): 0 10*3/uL (ref 0.0–0.1)
Immature Granulocytes: 0 %
Lymphocytes Absolute: 1.3 10*3/uL (ref 0.7–3.1)
Lymphs: 29 %
MCH: 32.7 pg (ref 26.6–33.0)
MCHC: 33.6 g/dL (ref 31.5–35.7)
MCV: 97 fL (ref 79–97)
Monocytes Absolute: 0.3 10*3/uL (ref 0.1–0.9)
Monocytes: 7 %
Neutrophils Absolute: 2.8 10*3/uL (ref 1.4–7.0)
Neutrophils: 61 %
Platelets: 273 10*3/uL (ref 150–450)
RBC: 4.04 x10E6/uL (ref 3.77–5.28)
RDW: 12.8 % (ref 11.7–15.4)
WBC: 4.5 10*3/uL (ref 3.4–10.8)

## 2021-03-29 LAB — LIPID PANEL WITH LDL/HDL RATIO
Cholesterol, Total: 204 mg/dL — ABNORMAL HIGH (ref 100–199)
HDL: 73 mg/dL (ref >39)
LDL Chol Calc (NIH): 114 mg/dL — ABNORMAL HIGH (ref 0–99)
LDL/HDL Ratio: 1.6 ratio (ref 0.0–3.2)
Triglycerides: 97 mg/dL (ref 0–149)
VLDL Cholesterol Cal: 17 mg/dL (ref 5–40)

## 2021-03-29 LAB — IRON,TIBC AND FERRITIN PANEL
Ferritin: 41 ng/mL (ref 15–150)
Iron Saturation: 23 % (ref 15–55)
Iron: 84 ug/dL (ref 27–139)
Total Iron Binding Capacity: 370 ug/dL (ref 250–450)
UIBC: 286 ug/dL (ref 118–369)

## 2021-03-29 LAB — CMP14+EGFR
ALT: 17 IU/L (ref 0–32)
AST: 28 IU/L (ref 0–40)
Albumin/Globulin Ratio: 2.6 — ABNORMAL HIGH (ref 1.2–2.2)
Albumin: 4.5 g/dL (ref 3.7–4.7)
Alkaline Phosphatase: 48 IU/L (ref 44–121)
BUN/Creatinine Ratio: 25 (ref 12–28)
BUN: 19 mg/dL (ref 8–27)
Bilirubin Total: 1 mg/dL (ref 0.0–1.2)
CO2: 25 mmol/L (ref 20–29)
Calcium: 10.2 mg/dL (ref 8.7–10.3)
Chloride: 101 mmol/L (ref 96–106)
Creatinine, Ser: 0.76 mg/dL (ref 0.57–1.00)
Globulin, Total: 1.7 g/dL (ref 1.5–4.5)
Glucose: 95 mg/dL (ref 70–99)
Potassium: 4.2 mmol/L (ref 3.5–5.2)
Sodium: 140 mmol/L (ref 134–144)
Total Protein: 6.2 g/dL (ref 6.0–8.5)
eGFR: 82 mL/min/{1.73_m2} (ref >59)

## 2021-04-01 ENCOUNTER — Other Ambulatory Visit: Payer: Self-pay

## 2021-04-01 ENCOUNTER — Ambulatory Visit (INDEPENDENT_AMBULATORY_CARE_PROVIDER_SITE_OTHER): Payer: PPO | Admitting: Nurse Practitioner

## 2021-04-01 VITALS — BP 139/61 | HR 68 | Ht 60.0 in | Wt 82.0 lb

## 2021-04-01 DIAGNOSIS — Z139 Encounter for screening, unspecified: Secondary | ICD-10-CM

## 2021-04-01 DIAGNOSIS — E785 Hyperlipidemia, unspecified: Secondary | ICD-10-CM | POA: Insufficient documentation

## 2021-04-01 DIAGNOSIS — Z1211 Encounter for screening for malignant neoplasm of colon: Secondary | ICD-10-CM

## 2021-04-01 DIAGNOSIS — E782 Mixed hyperlipidemia: Secondary | ICD-10-CM

## 2021-04-01 DIAGNOSIS — D509 Iron deficiency anemia, unspecified: Secondary | ICD-10-CM

## 2021-04-01 DIAGNOSIS — R636 Underweight: Secondary | ICD-10-CM | POA: Insufficient documentation

## 2021-04-01 DIAGNOSIS — I10 Essential (primary) hypertension: Secondary | ICD-10-CM | POA: Diagnosis not present

## 2021-04-01 DIAGNOSIS — E559 Vitamin D deficiency, unspecified: Secondary | ICD-10-CM | POA: Diagnosis not present

## 2021-04-01 DIAGNOSIS — E441 Mild protein-calorie malnutrition: Secondary | ICD-10-CM

## 2021-04-01 NOTE — Patient Instructions (Signed)
Please get your abs done 3-5 days before your next visit.    It is important that you exercise regularly at least 30 minutes 5 times a week.  Think about what you will eat, plan ahead. Choose " clean, green, fresh or frozen" over canned, processed or packaged foods which are more sugary, salty and fatty. 70 to 75% of food eaten should be vegetables and fruit. Three meals at set times with snacks allowed between meals, but they must be fruit or vegetables. Drink water,generally about 64 ounces per day, no other drink is as healthy. Fruit juice is best enjoyed in a healthy way, by EATING the fruit.  Thanks for choosing [facility], we consider it a privelige to serve you.

## 2021-04-01 NOTE — Progress Notes (Addendum)
° °  Michelle Jackson     MRN: [id number]      DOB: Jul 04, 1945   HPI Michelle Jackson is here for follow up for HTN and anemia. and re-evaluation of chronic medical conditions, medication management and review of any available recent lab and radiology data.  Preventive health is updated, specifically  Cancer screening and Immunization.   Questions or concerns regarding consultations or procedures which the PT has had in the interim are  addressed. The PT denies any adverse reactions to current medications since the last visit.   Pt stated that her neurologist will order new med for her memory because she is not tolerating donepezil 5mg  well.  Pt c/o anxiety, she is getting more anxious since she started taking medication for her memory. Pt unable to complete GAD7 screening today,  thinks that she will be fine once she gets her new medication for her memory.     ROS Denies recent fever or chills. Denies sinus pressure, nasal congestion, ear pain or sore throat. Denies chest congestion, productive cough or wheezing. Denies chest pains, palpitations and leg swelling Denies abdominal pain, nausea, vomiting,diarrhea or constipation.   Denies dysuria, frequency, hesitancy or incontinence. Wears knee brace on right knee, no swelling and limitation in mobility. Denies headaches, seizures, numbness, or tingling. Denies depression,  or insomnia. Has anxiety Denies skin break down or rash.   PE  BP 139/61    Pulse 68    Wt 82 lb (37.2 kg)    LMP  (LMP Unknown)    SpO2 98%    BMI 16.01 kg/m   Patient alert and oriented and in no cardiopulmonary distress. Gaunt appearance  HEENT: No facial asymmetry, EOMI,     Neck supple .  Chest: Clear to auscultation bilaterally.  CVS: S1, S2 no murmurs, no S3.Regular rate.  ABD: Soft non tender.   Ext: No edema  MS: Adequate ROM spine, shoulders, hips and knees. Knee Brace on right knee denies tenderness  Skin: Intact, no ulcerations or rash noted.  Psych:  Good eye contact, normal affect. Memory intact not anxious or depressed appearing.  CNS: CN 2-12 intact, power,  normal throughout.no focal deficits noted.   Assessment & Plan

## 2021-04-01 NOTE — Assessment & Plan Note (Signed)
DASH diet and commitment to daily physical activity for a minimum of 30 minutes discussed and encouraged, as a part of hypertension management. The importance of attaining a healthy weight is also discussed.  BP/Weight 04/01/2021 02/28/2021 01/09/2021 12/05/2020 10/01/2020 09/11/2020 1/44/8185  Systolic BP 631 497 - - 026 378 588  Diastolic BP 61 70 - - 74 72 75  Wt. (Lbs) 82 81.4 85 85 89 90 89  BMI 16.01 15.9 16.6 16.6 17.38 17.58 17.38   Continue current meds.

## 2021-04-01 NOTE — Assessment & Plan Note (Addendum)
Pt states that'' I have  always been skinny'' states that she takes ensure daily. Lost 9 lb in 10 months, will monitor and refer if she continues to loose weight by next visit. CBC and electrolytes are stable..  Wt Readings from Last 3 Encounters:  04/01/21 82 lb (37.2 kg)  02/28/21 81 lb 6.4 oz (36.9 kg)  01/09/21 85 lb (38.6 kg)  will monitor, denies loss of appetitie

## 2021-04-01 NOTE — Assessment & Plan Note (Signed)
GI referral made for  colonoscopy

## 2021-04-01 NOTE — Assessment & Plan Note (Signed)
Lab Results  Component Value Date   CHOL 204 (H) 03/28/2021   HDL 73 03/28/2021   LDLCALC 114 (H) 03/28/2021   TRIG 97 03/28/2021  avoid fatty, fried foods

## 2021-04-01 NOTE — Assessment & Plan Note (Signed)
Lbs are stable continue ferous sulfate 325mg  daily

## 2021-04-22 ENCOUNTER — Ambulatory Visit: Payer: PPO | Admitting: Gastroenterology

## 2021-05-27 ENCOUNTER — Telehealth: Payer: Self-pay | Admitting: Nurse Practitioner

## 2021-05-27 NOTE — Telephone Encounter (Signed)
Patient needs refill on  ? ?pantoprazole (PROTONIX) 40 MG tablet ? ?[facility] [address]  ?

## 2021-05-28 ENCOUNTER — Other Ambulatory Visit: Payer: Self-pay

## 2021-05-28 MED ORDER — PANTOPRAZOLE SODIUM 40 MG PO TBEC: 40.0000 mg | DELAYED_RELEASE_TABLET | Freq: Two times a day (BID) | ORAL | 0 refills | Status: DC

## 2021-05-28 NOTE — Telephone Encounter (Signed)
Sent to pharmacy 

## 2021-06-05 DIAGNOSIS — F419 Anxiety disorder, unspecified: Secondary | ICD-10-CM | POA: Diagnosis not present

## 2021-06-05 DIAGNOSIS — G3184 Mild cognitive impairment, so stated: Secondary | ICD-10-CM | POA: Diagnosis not present

## 2021-06-25 ENCOUNTER — Other Ambulatory Visit: Payer: Self-pay | Admitting: *Deleted

## 2021-06-25 ENCOUNTER — Telehealth: Payer: Self-pay

## 2021-06-25 MED ORDER — AMLODIPINE BESYLATE 5 MG PO TABS: 5.0000 mg | ORAL_TABLET | Freq: Every day | ORAL | 2 refills | Status: DC

## 2021-06-25 NOTE — Telephone Encounter (Signed)
Patient need med refill ?amLODipine (NORVASC) 5 MG tablet  ?Pharmacy: [facility] [place] ?

## 2021-06-25 NOTE — Telephone Encounter (Signed)
Medication sent to pharmacy  

## 2021-07-26 DIAGNOSIS — Z139 Encounter for screening, unspecified: Secondary | ICD-10-CM | POA: Diagnosis not present

## 2021-07-26 DIAGNOSIS — E782 Mixed hyperlipidemia: Secondary | ICD-10-CM | POA: Diagnosis not present

## 2021-07-26 DIAGNOSIS — I10 Essential (primary) hypertension: Secondary | ICD-10-CM | POA: Diagnosis not present

## 2021-07-26 DIAGNOSIS — E559 Vitamin D deficiency, unspecified: Secondary | ICD-10-CM | POA: Diagnosis not present

## 2021-07-27 LAB — CMP14+EGFR
ALT: 19 IU/L (ref 0–32)
AST: 26 IU/L (ref 0–40)
Albumin/Globulin Ratio: 1.9 (ref 1.2–2.2)
Albumin: 4.2 g/dL (ref 3.7–4.7)
Alkaline Phosphatase: 56 IU/L (ref 44–121)
BUN/Creatinine Ratio: 26 (ref 12–28)
BUN: 18 mg/dL (ref 8–27)
Bilirubin Total: 0.9 mg/dL (ref 0.0–1.2)
CO2: 26 mmol/L (ref 20–29)
Calcium: 9.7 mg/dL (ref 8.7–10.3)
Chloride: 103 mmol/L (ref 96–106)
Creatinine, Ser: 0.69 mg/dL (ref 0.57–1.00)
Globulin, Total: 2.2 g/dL (ref 1.5–4.5)
Glucose: 97 mg/dL (ref 70–99)
Potassium: 4.3 mmol/L (ref 3.5–5.2)
Sodium: 140 mmol/L (ref 134–144)
Total Protein: 6.4 g/dL (ref 6.0–8.5)
eGFR: 91 mL/min/{1.73_m2} (ref >59)

## 2021-07-27 LAB — LIPID PANEL
Chol/HDL Ratio: 2.4 ratio (ref 0.0–4.4)
Cholesterol, Total: 198 mg/dL (ref 100–199)
HDL: 84 mg/dL (ref >39)
LDL Chol Calc (NIH): 102 mg/dL — ABNORMAL HIGH (ref 0–99)
Triglycerides: 67 mg/dL (ref 0–149)
VLDL Cholesterol Cal: 12 mg/dL (ref 5–40)

## 2021-07-27 LAB — TSH: TSH: 0.984 u[IU]/mL (ref 0.450–4.500)

## 2021-07-27 LAB — VITAMIN D 25 HYDROXY (VIT D DEFICIENCY, FRACTURES): Vit D, 25-Hydroxy: 74.2 ng/mL (ref 30.0–100.0)

## 2021-07-31 ENCOUNTER — Encounter: Payer: Self-pay | Admitting: Nurse Practitioner

## 2021-07-31 ENCOUNTER — Ambulatory Visit (INDEPENDENT_AMBULATORY_CARE_PROVIDER_SITE_OTHER): Payer: PPO | Admitting: Nurse Practitioner

## 2021-07-31 VITALS — BP 130/64 | HR 60 | Ht 60.0 in | Wt 86.0 lb

## 2021-07-31 DIAGNOSIS — M8580 Other specified disorders of bone density and structure, unspecified site: Secondary | ICD-10-CM | POA: Diagnosis not present

## 2021-07-31 DIAGNOSIS — Z0000 Encounter for general adult medical examination without abnormal findings: Secondary | ICD-10-CM | POA: Diagnosis not present

## 2021-07-31 DIAGNOSIS — Z1231 Encounter for screening mammogram for malignant neoplasm of breast: Secondary | ICD-10-CM

## 2021-07-31 DIAGNOSIS — Z131 Encounter for screening for diabetes mellitus: Secondary | ICD-10-CM | POA: Diagnosis not present

## 2021-07-31 DIAGNOSIS — Z1211 Encounter for screening for malignant neoplasm of colon: Secondary | ICD-10-CM

## 2021-07-31 DIAGNOSIS — I10 Essential (primary) hypertension: Secondary | ICD-10-CM | POA: Diagnosis not present

## 2021-07-31 DIAGNOSIS — Z1159 Encounter for screening for other viral diseases: Secondary | ICD-10-CM | POA: Diagnosis not present

## 2021-07-31 DIAGNOSIS — Z0001 Encounter for general adult medical examination with abnormal findings: Secondary | ICD-10-CM | POA: Diagnosis not present

## 2021-07-31 DIAGNOSIS — Z8719 Personal history of other diseases of the digestive system: Secondary | ICD-10-CM

## 2021-07-31 MED ORDER — PANTOPRAZOLE SODIUM 40 MG PO TBEC: 40.0000 mg | DELAYED_RELEASE_TABLET | Freq: Every day | ORAL | 0 refills | Status: DC

## 2021-07-31 MED ORDER — PANTOPRAZOLE SODIUM 40 MG PO TBEC: 40.0000 mg | DELAYED_RELEASE_TABLET | Freq: Every day | ORAL | 0 refills | Status: DC | PRN

## 2021-07-31 NOTE — Assessment & Plan Note (Addendum)
BP Readings from Last 3 Encounters:  ?07/31/21 130/64  ?04/01/21 139/61  ?02/28/21 130/70  ?chronic condition well controlled on amlodipine '5mg'$  daily ?Continue current med, DAS diet advised  ?

## 2021-07-31 NOTE — Assessment & Plan Note (Signed)
Last DEXA scan was in 2019, was on reclast per previous documentation , pt stated that she is not aware of this  , current taking calcium with vitamin d , denies fracture ?DAXA scan ordered  ?

## 2021-07-31 NOTE — Assessment & Plan Note (Signed)
Annual exam as documented.  ?Counseling done include healthy lifestyle involving committing to 150 minutes of exercise per week, heart healthy diet, and attaining healthy weight. The importance of adequate sleep also discussed.  ?Regular use of seat belt and home safety were also discussed . ?Immunization and cancer screening  needs are specifically addressed at this visit.   ?

## 2021-07-31 NOTE — Patient Instructions (Addendum)
Please take pantoprazole '40mg'$  daily as needed  instead of '40mg'$  twice daily for acid reflux  ? ? ?It is important that you exercise regularly at least 30 minutes 5 times a week.  ?Think about what you will eat, plan ahead. ?Choose " clean, green, fresh or frozen" over canned, processed or packaged foods which are more sugary, salty and fatty. ?70 to 75% of food eaten should be vegetables and fruit. ?Three meals at set times with snacks allowed between meals, but they must be fruit or vegetables. ?Aim to eat over a 12 hour period , example 7 am to 7 pm, and STOP after  your last meal of the day. ?Drink water,generally about 64 ounces per day, no other drink is as healthy. Fruit juice is best enjoyed in a healthy way, by EATING the fruit. ? ?Thanks for choosing [facility], we consider it a privelige to serve you.  ?

## 2021-07-31 NOTE — Assessment & Plan Note (Signed)
Currently on pantoprazole '40mg'$  BID ?Pt told to take pantoprazole once daily PRN for acid reflux, pt told to avoid med if she does not experience acid reflux due to her osteopenia, she verbalized understanding.  ?

## 2021-07-31 NOTE — Progress Notes (Signed)
? ?Complete physical exam ? ?Patient: Michelle Jackson   DOB: 07/10/1945   76 y.o. Female  MRN: [id number] ? ?Subjective:  ?  ?Chief Complaint  ?Patient presents with  ? Annual Exam  ?  cpe  ? ? ?Michelle Jackson is a 76 y.o. female with past medical history of hypertension, osteopenia, high or near, hyperlipidemia who presents today for a complete physical exam. She reports consuming a general diet. The patient does not participate in regular exercise at present. She generally feels fairly well. She reports sleeping well. She does not have additional problems to discuss today.  ? ?Pt states that she has an upcoming eye exam at [facility]. Has dental exam every 6 months.  ?Due for colonoscopy, mammogram referral sent today ? ? ?Most recent fall risk assessment: ? ?  07/31/2021  ? 10:04 AM  ?Fall Risk   ?Falls in the past year? 0  ?Number falls in past yr: 0  ?Injury with Fall? 0  ?Risk for fall due to : No Fall Risks  ?Follow up Falls evaluation completed  ? ?  ?Most recent depression screenings: ? ?  04/01/2021  ?  3:42 PM 01/09/2021  ?  2:57 PM  ?PHQ 2/9 Scores  ?PHQ - 2 Score 0 0  ? ? ? ? ? ? ?Patient Care Team: ?Renee Rival, FNP as PCP - General (Nurse Practitioner)  ? ?Outpatient Medications Prior to Visit  ?Medication Sig  ? amLODipine (NORVASC) 5 MG tablet Take 1 tablet (5 mg total) by mouth daily.  ? ascorbic acid (VITAMIN C) 500 MG tablet Take 500 mg by mouth daily.  ? Biotin 10000 MCG TABS Take by mouth.  ? Calcium Carbonate-Vit D-Min (CALCIUM 1200 PO) Take by mouth.  ? cholecalciferol (VITAMIN D3) 25 MCG (1000 UNIT) tablet Take 1,000 Units by mouth daily.  ? ferrous sulfate 325 (65 FE) MG tablet Take 65 mg by mouth daily with breakfast.  ? Magnesium 400 MG TABS Take by mouth.  ? Multiple Vitamin (MULTIVITAMIN) tablet Take 1 tablet by mouth daily.  ? Omega-3 Fatty Acids (FISH OIL) 1200 MG CAPS Take by mouth. 2 daily  ? pyridOXINE (VITAMIN B-6) 100 MG tablet Take 100 mg by mouth daily.  ? valsartan (DIOVAN)  320 MG tablet Take 320 mg by mouth daily.  ? vitamin B-12 (CYANOCOBALAMIN) 100 MCG tablet Take 100 mcg by mouth daily.  ? [DISCONTINUED] pantoprazole (PROTONIX) 40 MG tablet Take 1 tablet (40 mg total) by mouth 2 (two) times daily.  ? calcium-vitamin D 250-100 MG-UNIT tablet Take 1 tablet by mouth 2 (two) times daily. (Patient not taking: Reported on 07/31/2021)  ? desonide (DESOWEN) 0.05 % lotion SMARTSIG:Sparingly Topical 2-3 Times Daily PRN (Patient not taking: Reported on 07/31/2021)  ? ?No facility-administered medications prior to visit.  ? ? ?Review of Systems  ?Constitutional: Negative.   ?HENT: Negative.    ?Eyes: Negative.   ?Respiratory: Negative.    ?Cardiovascular: Negative.   ?Gastrointestinal: Negative.   ?Genitourinary: Negative.   ?Musculoskeletal: Negative.   ?Skin: Negative.   ?Neurological: Negative.   ?Endo/Heme/Allergies: Negative.   ?Psychiatric/Behavioral: Negative.    ? ? ? ? ?   ?Objective:  ? ?  ?BP 130/64 (BP Location: Right Arm, Cuff Size: Normal)   Pulse 60   Ht 5' (1.524 m)   Wt 86 lb (39 kg)   LMP  (LMP Unknown)   SpO2 91%   BMI 16.80 kg/m?  ? ?[place] CMA present as chaperone ?Physical Exam ?  Constitutional:   ?   General: She is not in acute distress. ?   Appearance: She is not ill-appearing, toxic-appearing or diaphoretic.  ?   Comments: Thin appearing   ?HENT:  ?   Head: Normocephalic and atraumatic.  ?   Right Ear: Tympanic membrane, ear canal and external ear normal. There is no impacted cerumen.  ?   Left Ear: Tympanic membrane, ear canal and external ear normal. There is no impacted cerumen.  ?   Nose: Nose normal. No congestion or rhinorrhea.  ?   Mouth/Throat:  ?   Mouth: Mucous membranes are moist.  ?   Pharynx: Oropharynx is clear. No oropharyngeal exudate or posterior oropharyngeal erythema.  ?Eyes:  ?   General: No scleral icterus.    ?   Right eye: No discharge.     ?   Left eye: No discharge.  ?   Extraocular Movements: Extraocular movements intact.  ?    Conjunctiva/sclera: Conjunctivae normal.  ?   Pupils: Pupils are equal, round, and reactive to light.  ?Neck:  ?   Vascular: No carotid bruit.  ?Cardiovascular:  ?   Rate and Rhythm: Normal rate and regular rhythm.  ?   Pulses: Normal pulses.  ?   Heart sounds: No murmur heard. ?  No friction rub. No gallop.  ?Pulmonary:  ?   Effort: Pulmonary effort is normal. No respiratory distress.  ?   Breath sounds: Normal breath sounds. No stridor. No wheezing, rhonchi or rales.  ?Chest:  ?   Chest wall: No mass, lacerations, deformity, swelling, tenderness, crepitus or edema.  ?Breasts: ?   Tanner Score is 5.  ?   Breasts are symmetrical.  ?   Right: Normal. No swelling, bleeding, inverted nipple, mass, nipple discharge, skin change or tenderness.  ?   Left: No swelling, bleeding, inverted nipple, mass, nipple discharge, skin change or tenderness.  ?Abdominal:  ?   General: There is no distension.  ?   Palpations: Abdomen is soft. There is no mass.  ?   Tenderness: There is no abdominal tenderness. There is no right CVA tenderness, left CVA tenderness, guarding or rebound.  ?   Hernia: No hernia is present.  ?Musculoskeletal:     ?   General: No swelling, tenderness, deformity or signs of injury.  ?   Cervical back: Normal range of motion and neck supple. No rigidity or tenderness.  ?   Right lower leg: No edema.  ?   Left lower leg: No edema.  ?   Comments: Leg brace on right leg  ?Lymphadenopathy:  ?   Cervical: No cervical adenopathy.  ?   Upper Body:  ?   Right upper body: No supraclavicular, axillary or pectoral adenopathy.  ?   Left upper body: No supraclavicular, axillary or pectoral adenopathy.  ?Skin: ?   General: Skin is warm and dry.  ?   Capillary Refill: Capillary refill takes less than 2 seconds.  ?   Coloration: Skin is not jaundiced or pale.  ?   Findings: No bruising, erythema, lesion or rash.  ?Neurological:  ?   Mental Status: She is alert and oriented to person, place, and time.  ?   Cranial Nerves: No  cranial nerve deficit.  ?   Sensory: No sensory deficit.  ?   Motor: No weakness.  ?   Coordination: Coordination normal.  ?   Gait: Gait normal.  ?   Deep Tendon Reflexes: Reflexes normal.  ?Psychiatric:     ?  Mood and Affect: Mood normal.     ?   Behavior: Behavior normal.     ?   Thought Content: Thought content normal.     ?   Judgment: Judgment normal.  ?  ? ?No results found for any visits on 07/31/21. ? ?   ?Assessment & Plan:  ?  ?Routine Health Maintenance and Physical Exam ? ?Immunization History  ?Administered Date(s) Administered  ? Fluad Quad(high Dose 65+) 11/29/2015, 12/20/2017, 12/02/2020  ? Influenza Split 12/29/2011, 11/25/2013  ? Influenza, High Dose Seasonal PF 12/05/2012, 12/12/2014, 12/01/2016, 12/20/2017, 12/20/2018, 11/23/2019  ? Influenza,inj,quad, With Preservative 12/05/2012  ? Influenza-Unspecified 01/08/2011  ? Moderna Covid-19 Vaccine Bivalent Booster 18yr & up 12/21/2020  ? Moderna Sars-Covid-2 Vaccination 05/14/2019, 06/11/2019, 01/13/2020, 06/24/2020  ? Pneumococcal Conjugate-13 12/09/2013  ? Pneumococcal Polysaccharide-23 04/14/2012, 01/21/2017  ? Tdap 08/17/2003, 11/16/2015  ? Zoster Recombinat (Shingrix) 06/02/2017, 02/14/2019  ? Zoster, Live 11/16/2007  ? ? ?Health Maintenance  ?Topic Date Due  ? Hepatitis C Screening  Never done  ? COLONOSCOPY (Pts 45-441yrInsurance coverage will need to be confirmed)  Never done  ? INFLUENZA VACCINE  10/22/2021  ? MAMMOGRAM  11/01/2021  ? TETANUS/TDAP  11/15/2025  ? Pneumonia Vaccine 6539Years old  Completed  ? DEXA SCAN  Completed  ? COVID-19 Vaccine  Completed  ? Zoster Vaccines- Shingrix  Completed  ? HPV VACCINES  Aged Out  ? ? ?Discussed health benefits of physical activity, and encouraged her to engage in regular exercise appropriate for her age and condition. ? ?Problem List Items Addressed This Visit   ? ?  ? Cardiovascular and Mediastinum  ? Hypertension, essential  ?  BP Readings from Last 3 Encounters:  ?07/31/21 130/64   ?04/01/21 139/61  ?02/28/21 130/70  ?chronic condition well controlled on amlodipine '5mg'$  daily ?Continue current med, DAS diet advised  ?  ?  ?  ? Musculoskeletal and Integument  ? Osteopenia with high risk of fracture  ?  Last D

## 2021-08-01 LAB — HEPATITIS C ANTIBODY: Hep C Virus Ab: NONREACTIVE

## 2021-08-01 LAB — HEMOGLOBIN A1C
Est. average glucose Bld gHb Est-mCnc: 117 mg/dL
Hgb A1c MFr Bld: 5.7 % — ABNORMAL HIGH (ref 4.8–5.6)

## 2021-08-01 NOTE — Progress Notes (Signed)
Pt is prediabetic, reduced sugar , sweets, soda ?Hep C negative

## 2021-08-05 ENCOUNTER — Telehealth: Payer: Self-pay

## 2021-08-05 NOTE — Telephone Encounter (Signed)
Patient returning lab results call 

## 2021-08-06 ENCOUNTER — Telehealth: Payer: Self-pay | Admitting: Nurse Practitioner

## 2021-08-06 NOTE — Telephone Encounter (Signed)
Patient return called for lab results  ?

## 2021-08-06 NOTE — Telephone Encounter (Signed)
Spoke with pt

## 2021-08-06 NOTE — Telephone Encounter (Signed)
Patient aware.

## 2021-08-18 ENCOUNTER — Other Ambulatory Visit: Payer: Self-pay | Admitting: Nurse Practitioner

## 2021-08-20 ENCOUNTER — Encounter: Payer: Self-pay | Admitting: *Deleted

## 2021-09-03 ENCOUNTER — Encounter: Payer: Self-pay | Admitting: *Deleted

## 2021-09-03 NOTE — Patient Instructions (Addendum)
Referring MD/PCP: Vena Rua  Procedure: Colonoscopy  Has patient had this procedure before?  Yes, ? 5-6 years ago in [place]  If so, when, by whom and where?    Is there a family history of colon cancer?  no  Who?  What age when diagnosed?    Is patient diabetic? If yes, Type 1 or Type 2   no      Does patient have prosthetic heart valve or mechanical valve?  no  Do you have a pacemaker/defibrillator?  no  Has patient ever had endocarditis/atrial fibrillation? no  Does patient use oxygen? no  Has patient had joint replacement within last 12 months?  no  Is patient constipated or do they take laxatives? Yes, constipation  Does patient have a history of alcohol/drug use?  no  Have you had a stroke/heart attack last 6 mths? no  Do you take medicine for weight loss?  no  For female patients,: have you had a hysterectomy no                      are you post menopausal yes                      do you still have your menstrual cycle no  Is patient on blood thinner such as Coumadin, Plavix and/or Aspirin? Yes, aspirin as needed  Medications:  Current Outpatient Medications on File Prior to Visit  Medication Sig Dispense Refill   amLODipine (NORVASC) 5 MG tablet TAKE 1 TABLET (5 MG TOTAL) BY MOUTH DAILY. 90 tablet 1   ascorbic acid (VITAMIN C) 500 MG tablet Take 500 mg by mouth daily.     Biotin 10000 MCG TABS Take by mouth.     Calcium Carbonate-Vit D-Min (CALCIUM 1200 PO) Take by mouth.     cholecalciferol (VITAMIN D3) 25 MCG (1000 UNIT) tablet Take 1,000 Units by mouth daily.     ferrous sulfate 325 (65 FE) MG tablet Take 65 mg by mouth daily with breakfast.     Magnesium 400 MG TABS Take by mouth.     Multiple Vitamin (MULTIVITAMIN) tablet Take 1 tablet by mouth daily.     Omega-3 Fatty Acids (FISH OIL) 1200 MG CAPS Take by mouth. 2 daily     pantoprazole (PROTONIX) 40 MG tablet Take 1 tablet (40 mg total) by mouth daily as needed. 90 tablet 0   pyridOXINE  (VITAMIN B-6) 100 MG tablet Take 100 mg by mouth daily.     valsartan (DIOVAN) 320 MG tablet Take 320 mg by mouth daily.     vitamin B-12 (CYANOCOBALAMIN) 100 MCG tablet Take 100 mcg by mouth daily.     No current facility-administered medications on file prior to visit.     Allergies:  Allergies  Allergen Reactions   Iodinated Contrast Media Hives   Other Hives    IVP Dye IVP Dye    Tape Other (See Comments)    Pt states she does better with paper tape

## 2021-09-20 ENCOUNTER — Ambulatory Visit
Admission: RE | Admit: 2021-09-20 | Discharge: 2021-09-20 | Disposition: A | Discharge status: Home / Self Care | Payer: PPO | Source: Ambulatory Visit | Attending: Nurse Practitioner | Admitting: Nurse Practitioner

## 2021-09-20 DIAGNOSIS — Z1231 Encounter for screening mammogram for malignant neoplasm of breast: Secondary | ICD-10-CM | POA: Diagnosis not present

## 2021-09-20 NOTE — Progress Notes (Signed)
Reviewed records available: Pathology from colonoscopy 2011, random colon biopsies negative. Cologuard 2018: negative.  OK to schedule. ASA 2. Hold iron 7 days. Start bisocodyl '10mg'$  daily for 3 days before clear liquids start.

## 2021-09-26 ENCOUNTER — Other Ambulatory Visit: Payer: Self-pay | Admitting: Nurse Practitioner

## 2021-09-26 DIAGNOSIS — R928 Other abnormal and inconclusive findings on diagnostic imaging of breast: Secondary | ICD-10-CM

## 2021-10-02 ENCOUNTER — Ambulatory Visit
Admission: RE | Admit: 2021-10-02 | Discharge: 2021-10-02 | Disposition: A | Discharge status: Home / Self Care | Payer: PPO | Source: Ambulatory Visit | Attending: Nurse Practitioner | Admitting: Nurse Practitioner

## 2021-10-02 DIAGNOSIS — R928 Other abnormal and inconclusive findings on diagnostic imaging of breast: Secondary | ICD-10-CM

## 2021-10-02 DIAGNOSIS — N6489 Other specified disorders of breast: Secondary | ICD-10-CM | POA: Diagnosis not present

## 2021-10-03 NOTE — Progress Notes (Signed)
Normal test .Screening mammogram in one year

## 2021-10-04 ENCOUNTER — Telehealth (INDEPENDENT_AMBULATORY_CARE_PROVIDER_SITE_OTHER): Payer: Self-pay

## 2021-10-04 ENCOUNTER — Other Ambulatory Visit (INDEPENDENT_AMBULATORY_CARE_PROVIDER_SITE_OTHER): Payer: Self-pay

## 2021-10-04 ENCOUNTER — Encounter (INDEPENDENT_AMBULATORY_CARE_PROVIDER_SITE_OTHER): Payer: Self-pay

## 2021-10-04 DIAGNOSIS — Z1211 Encounter for screening for malignant neoplasm of colon: Secondary | ICD-10-CM

## 2021-10-04 MED ORDER — PEG 3350-KCL-NA BICARB-NACL 420 G PO SOLR: 4000.0000 mL | ORAL | 0 refills | Status: DC

## 2021-10-04 NOTE — Telephone Encounter (Signed)
Michelle Jackson Michelle Jackson, CMA  ?

## 2021-10-07 ENCOUNTER — Encounter: Payer: Self-pay | Admitting: *Deleted

## 2021-10-30 ENCOUNTER — Other Ambulatory Visit (INDEPENDENT_AMBULATORY_CARE_PROVIDER_SITE_OTHER): Payer: Self-pay

## 2021-10-31 ENCOUNTER — Encounter (INDEPENDENT_AMBULATORY_CARE_PROVIDER_SITE_OTHER): Payer: Self-pay

## 2021-11-02 ENCOUNTER — Other Ambulatory Visit: Payer: Self-pay | Admitting: Nurse Practitioner

## 2021-11-05 ENCOUNTER — Encounter (HOSPITAL_COMMUNITY): Payer: Self-pay

## 2021-11-05 ENCOUNTER — Ambulatory Visit (HOSPITAL_COMMUNITY): Payer: PPO | Admitting: Anesthesiology

## 2021-11-05 ENCOUNTER — Ambulatory Visit (HOSPITAL_BASED_OUTPATIENT_CLINIC_OR_DEPARTMENT_OTHER): Payer: PPO | Admitting: Anesthesiology

## 2021-11-05 ENCOUNTER — Other Ambulatory Visit: Payer: Self-pay

## 2021-11-05 ENCOUNTER — Ambulatory Visit (HOSPITAL_COMMUNITY)
Admission: RE | Admit: 2021-11-05 | Discharge: 2021-11-05 | Disposition: A | Discharge status: Home / Self Care | Payer: PPO | Source: Ambulatory Visit | Attending: Internal Medicine | Admitting: Internal Medicine

## 2021-11-05 ENCOUNTER — Encounter (HOSPITAL_COMMUNITY)
Admission: RE | Disposition: A | Discharge status: Home / Self Care | Payer: Self-pay | Source: Ambulatory Visit | Attending: Internal Medicine

## 2021-11-05 DIAGNOSIS — Z1212 Encounter for screening for malignant neoplasm of rectum: Secondary | ICD-10-CM

## 2021-11-05 DIAGNOSIS — K5730 Diverticulosis of large intestine without perforation or abscess without bleeding: Secondary | ICD-10-CM | POA: Insufficient documentation

## 2021-11-05 DIAGNOSIS — D123 Benign neoplasm of transverse colon: Secondary | ICD-10-CM

## 2021-11-05 DIAGNOSIS — I10 Essential (primary) hypertension: Secondary | ICD-10-CM | POA: Insufficient documentation

## 2021-11-05 DIAGNOSIS — D649 Anemia, unspecified: Secondary | ICD-10-CM | POA: Diagnosis not present

## 2021-11-05 DIAGNOSIS — K635 Polyp of colon: Secondary | ICD-10-CM

## 2021-11-05 DIAGNOSIS — K219 Gastro-esophageal reflux disease without esophagitis: Secondary | ICD-10-CM | POA: Insufficient documentation

## 2021-11-05 DIAGNOSIS — D759 Disease of blood and blood-forming organs, unspecified: Secondary | ICD-10-CM | POA: Diagnosis not present

## 2021-11-05 DIAGNOSIS — K449 Diaphragmatic hernia without obstruction or gangrene: Secondary | ICD-10-CM | POA: Insufficient documentation

## 2021-11-05 DIAGNOSIS — Z1211 Encounter for screening for malignant neoplasm of colon: Secondary | ICD-10-CM | POA: Diagnosis not present

## 2021-11-05 HISTORY — PX: COLONOSCOPY WITH PROPOFOL: SHX5780

## 2021-11-05 HISTORY — PX: POLYPECTOMY: SHX5525

## 2021-11-05 SURGERY — COLONOSCOPY WITH PROPOFOL
Anesthesia: General

## 2021-11-05 MED ORDER — LIDOCAINE 2% (20 MG/ML) 5 ML SYRINGE
INTRAMUSCULAR | Status: DC | PRN
  Administered 2021-11-05: 50 mg via INTRAVENOUS

## 2021-11-05 MED ORDER — PROPOFOL 10 MG/ML IV BOLUS
INTRAVENOUS | Status: DC | PRN
  Administered 2021-11-05: 50 mg via INTRAVENOUS

## 2021-11-05 MED ORDER — PROPOFOL 500 MG/50ML IV EMUL
INTRAVENOUS | Status: DC | PRN
  Administered 2021-11-05: 200 ug/kg/min via INTRAVENOUS

## 2021-11-05 MED ORDER — LACTATED RINGERS IV SOLN: INTRAVENOUS | Status: DC

## 2021-11-05 NOTE — Transfer of Care (Signed)
Immediate Anesthesia Transfer of Care Note  Patient: Michelle Jackson  Procedure(s) Performed: COLONOSCOPY WITH PROPOFOL POLYPECTOMY  Patient Location: Endoscopy Unit  Anesthesia Type:MAC  Level of Consciousness: awake, alert , oriented and patient cooperative  Airway & Oxygen Therapy: Patient Spontanous Breathing  Post-op Assessment: Report given to RN, Post -op Vital signs reviewed and stable and Patient moving all extremities  Post vital signs: Reviewed and stable  Last Vitals:  Vitals Value Taken Time  BP 93/42 11/05/21 1110  Temp 36.9 C 11/05/21 1110  Pulse 54 11/05/21 1110  Resp 22 11/05/21 1110  SpO2 96 % 11/05/21 1110    Last Pain:  Vitals:   11/05/21 1110  TempSrc: Oral  PainSc: 0-No pain      Patients Stated Pain Goal: 2 (94/44/61 [id number])  Complications: No notable events documented.

## 2021-11-05 NOTE — Anesthesia Preprocedure Evaluation (Addendum)
Anesthesia Evaluation  Patient identified by MRN, date of birth, ID band Patient awake    Reviewed: Allergy & Precautions, NPO status , Patient's Chart, lab work & pertinent test results  Airway Mallampati: II  TM Distance: >3 FB Neck ROM: Full    Dental  (+) Dental Advisory Given, Caps   Pulmonary neg pulmonary ROS,    Pulmonary exam normal breath sounds clear to auscultation       Cardiovascular Exercise Tolerance: Good hypertension, Pt. on medications  Rhythm:Regular Rate:Bradycardia     Neuro/Psych PSYCHIATRIC DISORDERS Depression  Neuromuscular disease (right femoral nerve injury)    GI/Hepatic Neg liver ROS, hiatal hernia, GERD  Medicated and Controlled,  Endo/Other  negative endocrine ROS  Renal/GU negative Renal ROS  negative genitourinary   Musculoskeletal  (+) Arthritis , Osteoarthritis,    Abdominal   Peds negative pediatric ROS (+)  Hematology  (+) Blood dyscrasia, anemia ,   Anesthesia Other Findings   Reproductive/Obstetrics negative OB ROS                            Anesthesia Physical Anesthesia Plan  ASA: 2  Anesthesia Plan: General   Post-op Pain Management: Minimal or no pain anticipated   Induction: Intravenous  PONV Risk Score and Plan: Propofol infusion  Airway Management Planned: Nasal Cannula and Natural Airway  Additional Equipment:   Intra-op Plan:   Post-operative Plan:   Informed Consent: I have reviewed the patients History and Physical, chart, labs and discussed the procedure including the risks, benefits and alternatives for the proposed anesthesia with the patient or authorized representative who has indicated his/her understanding and acceptance.     Dental advisory given  Plan Discussed with: CRNA and Surgeon  Anesthesia Plan Comments:        Anesthesia Quick Evaluation

## 2021-11-05 NOTE — Op Note (Signed)
[facility] Patient Name: Michelle Jackson Procedure Date: 11/05/2021 10:25 AM MRN: [id number] Date of Birth: 06-17-1945 Attending MD: Elon Alas. Edgar Frisk CSN: [id number] Age: 76 Admit Type: Outpatient Procedure:                Colonoscopy Indications:              Screening for colorectal malignant neoplasm Providers:                Elon Alas. Abbey Chatters, DO, Caprice Kluver, [place]                            Risa Grill, Technician, Bethel Born,                            [employer] Referring MD:              Medicines:                See the Anesthesia note for documentation of the                            administered medications Complications:            No immediate complications. Estimated Blood Loss:     Estimated blood loss was minimal. Procedure:                Pre-Anesthesia Assessment:                           - The anesthesia plan was to use monitored                            anesthesia care (MAC).                           After obtaining informed consent, the colonoscope                            was passed under direct vision. Throughout the                            procedure, the patient's blood pressure, pulse, and                            oxygen saturations were monitored continuously. The                            [id number]-HQ190L ([id number]) scope was introduced through                            the anus and advanced to the the cecum, identified                            by appendiceal orifice and ileocecal valve. The                            colonoscopy was performed without difficulty. The  patient tolerated the procedure well. The quality                            of the bowel preparation was evaluated using the                            BBPS [facility] Bowel Preparation Scale) with scores                            of: Right Colon = 3, Transverse Colon = 3 and Left                            Colon = 3 (entire mucosa  seen well with no residual                            staining, small fragments of stool or opaque                            liquid). The total BBPS score equals 9. Scope In: 10:50:24 AM Scope Out: 00:71:21 AM Scope Withdrawal Time: 0 hours 6 minutes 47 seconds  Total Procedure Duration: 0 hours 15 minutes 45 seconds  Findings:      The perianal and digital rectal examinations were normal.      Multiple small and large-mouthed diverticula were found in the sigmoid       colon and descending colon.      Two sessile polyps were found in the transverse colon. The polyps were 1       to 2 mm in size. These polyps were removed with a cold biopsy forceps.       Resection and retrieval were complete.      The exam was otherwise without abnormality. Impression:               - Diverticulosis in the sigmoid colon and in the                            descending colon.                           - Two 1 to 2 mm polyps in the transverse colon,                            removed with a cold biopsy forceps. Resected and                            retrieved.                           - The examination was otherwise normal. Moderate Sedation:      Per Anesthesia Care Recommendation:           - Patient has a contact number available for                            emergencies. The signs and symptoms of potential  delayed complications were discussed with the                            patient. Return to normal activities tomorrow.                            Written discharge instructions were provided to the                            patient.                           - Resume previous diet.                           - Continue present medications.                           - Await pathology results.                           - Repeat colonoscopy in 5-10 years for surveillance.                           - Return to GI clinic PRN. Procedure Code(s):        --- Professional  ---                           [phone], Colonoscopy, flexible; with biopsy, single                            or multiple Diagnosis Code(s):        --- Professional ---                           Z12.11, Encounter for screening for malignant                            neoplasm of colon                           K63.5, Polyp of colon                           K57.30, Diverticulosis of large intestine without                            perforation or abscess without bleeding CPT copyright 2019 American Medical Association. All rights reserved. The codes documented in this report are preliminary and upon coder review may  be revised to meet current compliance requirements. Elon Alas. Abbey Chatters, DO [place] Abbey Chatters, DO 11/05/2021 11:13:49 AM This report has been signed electronically. Number of Addenda: 0

## 2021-11-05 NOTE — Anesthesia Postprocedure Evaluation (Signed)
Anesthesia Post Note  Patient: Michelle Jackson  Procedure(s) Performed: COLONOSCOPY WITH PROPOFOL POLYPECTOMY  Patient location during evaluation: Phase II Anesthesia Type: General Level of consciousness: awake and alert and oriented Pain management: pain level controlled Vital Signs Assessment: post-procedure vital signs reviewed and stable Respiratory status: spontaneous breathing, nonlabored ventilation and respiratory function stable Cardiovascular status: blood pressure returned to baseline and stable Postop Assessment: no apparent nausea or vomiting Anesthetic complications: no   No notable events documented.   Last Vitals:  Vitals:   11/05/21 0924 11/05/21 1110  BP: 139/67 (!) 93/42  Pulse: 63 (!) 54  Resp: 19 (!) 22  Temp: 36.6 C 36.9 C  SpO2: 100% 96%    Last Pain:  Vitals:   11/05/21 1110  TempSrc: Oral  PainSc: 0-No pain                 Ilithyia Titzer C Darly Massi

## 2021-11-05 NOTE — H&P (Signed)
Primary Care Physician:  Renee Rival, FNP Primary Gastroenterologist:  Dr. Abbey Chatters  Pre-Procedure History & Physical: HPI:  Michelle Jackson is a 76 y.o. female is here for a colonoscopy for colon cancer screening purposes.  Patient denies any family history of colorectal cancer.  No melena or hematochezia.  No abdominal pain or unintentional weight loss.  No change in bowel habits.  Overall feels well from a GI standpoint.  Past Medical History:  Diagnosis Date   GERD (gastroesophageal reflux disease)    Hypertension    Neuromuscular disorder ([place])    femoral nerve severed during hernia surgery; wears right knee brace    Past Surgical History:  Procedure Laterality Date   HERNIA REPAIR      Prior to Admission medications   Medication Sig Start Date End Date Taking? Authorizing Provider  amLODipine (NORVASC) 5 MG tablet TAKE 1 TABLET (5 MG TOTAL) BY MOUTH DAILY. 08/20/21  Yes Paseda, Dewaine Conger, FNP  Apoaequorin (PREVAGEN PO) Take 1 tablet by mouth daily.   Yes [provider]  Calcium Carbonate Antacid (CALCIUM CARBONATE PO) Take 1,200 mg by mouth daily.   Yes [provider]  Calcium Carbonate-Vit D-Min (CALCIUM 1200 PO) Take 600 mg by mouth 2 (two) times daily. Magnesium  Zinc Copper Manganese sulfate   Yes [provider]  Cholecalciferol (VITAMIN D) 50 MCG (2000 UT) CAPS Take 2,000 Units by mouth daily.   Yes [provider]  Ferrous Sulfate (IRON) 28 MG TABS Take 28 mg by mouth daily.   Yes [provider]  Ginkgo Biloba 120 MG TABS Take 120 mg by mouth daily.   Yes [provider]  Glycerin-Hypromellose-PEG 400 (DRY EYE RELIEF DROPS OP) Place 1 drop into both eyes daily as needed (Dry eyes).   Yes [provider]  Lysine 1000 MG TABS Take 1,000 mg by mouth daily.   Yes [provider]  Magnesium 200 MG TABS Take 400 mg by mouth daily. Gummy   Yes [provider]  Melatonin 10 MG TABS Take  20 mg by mouth at bedtime. Gummy   Yes [provider]  Multiple Vitamin (MULTIVITAMIN) tablet Take 1 tablet by mouth daily. Woman's 50+ With Iron   Yes [provider]  Multiple Vitamins-Minerals (HAIR/SKIN/NAILS) CAPS Take 1 capsule by mouth daily.   Yes [provider]  Multiple Vitamins-Minerals (MULTIVITAMIN WITH MINERALS) tablet Take 1 tablet by mouth daily. Gummy   Yes [provider]  Omega-3 Fatty Acids (FISH OIL) 1200 MG CAPS Take 1,200 mg by mouth 2 (two) times daily. 2 daily   Yes [provider]  pantoprazole (PROTONIX) 40 MG tablet TAKE 1 TABLET BY MOUTH EVERY DAY 11/04/21  Yes Paseda, Folashade R, FNP  polyethylene glycol-electrolytes (TRILYTE) 420 g solution Take 4,000 mLs by mouth as directed. 10/04/21  Yes Sheridan Gettel K, DO  valsartan (DIOVAN) 320 MG tablet Take 320 mg by mouth daily.   Yes [provider]    Allergies as of 10/04/2021 - Review Complete 07/31/2021  Allergen Reaction Noted   Iodinated contrast media Hives 10/25/2010   Other Hives 12/29/2011   Tape Other (See Comments) 01/22/2011    Family History  Problem Relation Age of Onset   Colon cancer Neg Hx    Breast cancer Neg Hx     Social History   Socioeconomic History   Marital status: Divorced    Spouse name: Not on file   Number of children: 0   Years  of education: Not on file   Highest education level: Not on file  Occupational History   Not on file  Tobacco Use   Smoking status: Never   Smokeless tobacco: Never  Vaping Use   Vaping Use: Never used  Substance and Sexual Activity   Alcohol use: Yes    Alcohol/week: 1.0 standard drink of alcohol    Types: 1 Glasses of wine per week    Comment: occaisional    Drug use: Never   Sexual activity: Yes    Birth control/protection: Post-menopausal  Other Topics Concern   Not on file  Social History Narrative   Lives alone but pt states she has a current boyfriend and close friends and  neighbors.   Social Determinants of Health   Financial Resource Strain: Low Risk  (01/09/2021)   Overall Financial Resource Strain (CARDIA)    Difficulty of Paying Living Expenses: Not hard at all  Food Insecurity: No Food Insecurity (01/09/2021)   Hunger Vital Sign    Worried About Running Out of Food in the Last Year: Never true    Ran Out of Food in the Last Year: Never true  Transportation Needs: No Transportation Needs (01/09/2021)   PRAPARE - Hydrologist (Medical): No    Lack of Transportation (Non-Medical): No  Physical Activity: Inactive (01/09/2021)   Exercise Vital Sign    Days of Exercise per Week: 0 days    Minutes of Exercise per Session: 0 min  Stress: No Stress Concern Present (01/09/2021)   [place]    Feeling of Stress : Not at all  Social Connections: Moderately Isolated (01/09/2021)   Social Connection and Isolation Panel [NHANES]    Frequency of Communication with Friends and Family: More than three times a week    Frequency of Social Gatherings with Friends and Family: More than three times a week    Attends Religious Services: Never    Marine scientist or Organizations: Yes    Attends Archivist Meetings: Never    Marital Status: Divorced  Human resources officer Violence: Not At Risk (01/09/2021)   Humiliation, Afraid, Rape, and Kick questionnaire    Fear of Current or Ex-Partner: No    Emotionally Abused: No    Physically Abused: No    Sexually Abused: No    Review of Systems: See HPI, otherwise negative ROS  Physical Exam: Vital signs in last 24 hours: Temp:  [97.9 F (36.6 C)] 97.9 F (36.6 C) (08/15 0924) Pulse Rate:  [63] 63 (08/15 0924) Resp:  [19] 19 (08/15 0924) BP: (139)/(67) 139/67 (08/15 0924) SpO2:  [100 %] 100 % (08/15 0924) Weight:  [34.9 kg] 34.9 kg (08/15 0924)   General:   Alert,  Well-developed, well-nourished, pleasant  and cooperative in NAD Head:  Normocephalic and atraumatic. Eyes:  Sclera clear, no icterus.   Conjunctiva pink. Ears:  Normal auditory acuity. Nose:  No deformity, discharge,  or lesions. Mouth:  No deformity or lesions, dentition normal. Neck:  Supple; no masses or thyromegaly. Lungs:  Clear throughout to auscultation.   No wheezes, crackles, or rhonchi. No acute distress. Heart:  Regular rate and rhythm; no murmurs, clicks, rubs,  or gallops. Abdomen:  Soft, nontender and nondistended. No masses, hepatosplenomegaly or hernias noted. Normal bowel sounds, without guarding, and without rebound.   Msk:  Symmetrical without gross deformities. Normal posture. Extremities:  Without clubbing or edema. Neurologic:  Alert and  oriented x4;  grossly normal neurologically. Skin:  Intact without significant lesions or rashes. Cervical Nodes:  No significant cervical adenopathy. Psych:  Alert and cooperative. Normal mood and affect.  Impression/Plan: Michelle Jackson is here for a colonoscopy to be performed for colon cancer screening purposes.  The risks of the procedure including infection, bleed, or perforation as well as benefits, limitations, alternatives and imponderables have been reviewed with the patient. Questions have been answered. All parties agreeable.

## 2021-11-05 NOTE — Discharge Instructions (Addendum)
  Colonoscopy Discharge Instructions  Read the instructions outlined below and refer to this sheet in the next few weeks. These discharge instructions provide you with general information on caring for yourself after you leave the hospital. Your doctor may also give you specific instructions. While your treatment has been planned according to the most current medical practices available, unavoidable complications occasionally occur.   ACTIVITY You may resume your regular activity, but move at a slower pace for the next 24 hours.  Take frequent rest periods for the next 24 hours.  Walking will help get rid of the air and reduce the bloated feeling in your belly (abdomen).  No driving for 24 hours (because of the medicine (anesthesia) used during the test).   Do not sign any important legal documents or operate any machinery for 24 hours (because of the anesthesia used during the test).  NUTRITION Drink plenty of fluids.  You may resume your normal diet as instructed by your doctor.  Begin with a light meal and progress to your normal diet. Heavy or fried foods are harder to digest and may make you feel sick to your stomach (nauseated).  Avoid alcoholic beverages for 24 hours or as instructed.  MEDICATIONS You may resume your normal medications unless your doctor tells you otherwise.  WHAT YOU CAN EXPECT TODAY Some feelings of bloating in the abdomen.  Passage of more gas than usual.  Spotting of blood in your stool or on the toilet paper.  IF YOU HAD POLYPS REMOVED DURING THE COLONOSCOPY: No aspirin products for 7 days or as instructed.  No alcohol for 7 days or as instructed.  Eat a soft diet for the next 24 hours.  FINDING OUT THE RESULTS OF YOUR TEST Not all test results are available during your visit. If your test results are not back during the visit, make an appointment with your caregiver to find out the results. Do not assume everything is normal if you have not heard from your  caregiver or the medical facility. It is important for you to follow up on all of your test results.  SEEK IMMEDIATE MEDICAL ATTENTION IF: You have more than a spotting of blood in your stool.  Your belly is swollen (abdominal distention).  You are nauseated or vomiting.  You have a temperature over 101.  You have abdominal pain or discomfort that is severe or gets worse throughout the day.   Your colonoscopy revealed 2 polyp(s) which I removed successfully. Await pathology results, my office will contact you. I recommend repeating colonoscopy in 5-10 years for surveillance purposes depending on pathology results.   You also have diverticulosis. I would recommend increasing fiber in your diet or adding OTC Benefiber/Metamucil. Be sure to drink at least 4 to 6 glasses of water daily. Follow-up with GI as needed.    I hope you have a great rest of your week!  Elon Alas. Abbey Chatters, D.O. Gastroenterology and Hepatology [facility]

## 2021-11-06 LAB — SURGICAL PATHOLOGY

## 2021-11-08 ENCOUNTER — Encounter (HOSPITAL_COMMUNITY): Payer: Self-pay | Admitting: Internal Medicine

## 2021-12-02 ENCOUNTER — Telehealth: Payer: Self-pay | Admitting: Nurse Practitioner

## 2021-12-02 ENCOUNTER — Other Ambulatory Visit: Payer: Self-pay

## 2021-12-02 DIAGNOSIS — I10 Essential (primary) hypertension: Secondary | ICD-10-CM

## 2021-12-02 MED ORDER — VALSARTAN 320 MG PO TABS: 320.0000 mg | ORAL_TABLET | Freq: Every day | ORAL | 2 refills | Status: DC

## 2021-12-02 NOTE — Telephone Encounter (Signed)
Refill sent, let pt know.

## 2021-12-02 NOTE — Telephone Encounter (Signed)
Fola pt**   Pt is going out of town tomorrow & is needing a refill on valsartan (DIOVAN) 320 MG tablet . Can you please refill?  Needs today if possible & please call when sent    [facility] 

## 2022-01-13 ENCOUNTER — Ambulatory Visit: Payer: PPO

## 2022-01-14 ENCOUNTER — Encounter: Payer: PPO | Admitting: Internal Medicine

## 2022-01-21 ENCOUNTER — Encounter: Payer: Self-pay | Admitting: Internal Medicine

## 2022-01-21 ENCOUNTER — Ambulatory Visit (INDEPENDENT_AMBULATORY_CARE_PROVIDER_SITE_OTHER): Payer: PPO | Admitting: Internal Medicine

## 2022-01-21 DIAGNOSIS — Z0000 Encounter for general adult medical examination without abnormal findings: Secondary | ICD-10-CM | POA: Diagnosis not present

## 2022-01-21 NOTE — Progress Notes (Signed)
Subjective:  This is a telephone encounter between Michelle Jackson and Michelle Jackson on 01/21/2022 for AWV. The visit was conducted with the patient located at home and Michelle Jackson at [facility]. The patient's identity was confirmed using their DOB and current address. The patient has consented to being evaluated through a telephone encounter and understands the associated risks (an examination cannot be done and the patient may need to come in for an appointment) / benefits (allows the patient to remain at home, decreasing exposure to coronavirus).    Michelle Jackson is a 76 y.o. female who presents for Medicare Annual (Subsequent) preventive examination.  Review of Systems    Review of Systems  All other systems reviewed and are negative.  Objective:    There were no vitals filed for this visit. There is no height or weight on file to calculate BMI.     01/21/2022    1:13 PM 11/05/2021    9:29 AM 01/09/2021    3:03 PM  Advanced Directives  Does Patient Have a Medical Advance Directive? No No No  Would patient like information on creating a medical advance directive? Yes (MAU/Ambulatory/Procedural Areas - Information given) No - Patient declined No - Patient declined    Current Medications (verified) Outpatient Encounter Medications as of 01/21/2022  Medication Sig   amLODipine (NORVASC) 5 MG tablet TAKE 1 TABLET (5 MG TOTAL) BY MOUTH DAILY.   Apoaequorin (PREVAGEN PO) Take 1 tablet by mouth daily.   Calcium Carbonate Antacid (CALCIUM CARBONATE PO) Take 1,200 mg by mouth daily.   Calcium Carbonate-Vit D-Min (CALCIUM 1200 PO) Take 600 mg by mouth 2 (two) times daily. Magnesium  Zinc Copper Manganese sulfate   Cholecalciferol (VITAMIN D) 50 MCG (2000 UT) CAPS Take 2,000 Units by mouth daily.   Ginkgo Biloba 120 MG TABS Take 120 mg by mouth daily.   Glycerin-Hypromellose-PEG 400 (DRY EYE RELIEF DROPS OP) Place 1 drop into both eyes daily as needed (Dry eyes).   Lysine 1000 MG TABS Take  1,000 mg by mouth daily.   Magnesium 200 MG TABS Take 400 mg by mouth daily. Gummy   Melatonin 10 MG TABS Take 20 mg by mouth at bedtime. Gummy   Multiple Vitamin (MULTIVITAMIN) tablet Take 1 tablet by mouth daily. Woman's 50+ With Iron   Multiple Vitamins-Minerals (HAIR/SKIN/NAILS) CAPS Take 1 capsule by mouth daily.   Multiple Vitamins-Minerals (MULTIVITAMIN WITH MINERALS) tablet Take 1 tablet by mouth daily. Gummy   Omega-3 Fatty Acids (FISH OIL) 1200 MG CAPS Take 1,200 mg by mouth 2 (two) times daily. 2 daily   pantoprazole (PROTONIX) 40 MG tablet TAKE 1 TABLET BY MOUTH EVERY DAY   valsartan (DIOVAN) 320 MG tablet Take 1 tablet (320 mg total) by mouth daily.   [DISCONTINUED] Ferrous Sulfate (IRON) 28 MG TABS Take 28 mg by mouth daily.   No facility-administered encounter medications on file as of 01/21/2022.    Allergies (verified) Iodinated contrast media, Other, and Tape   History: Past Medical History:  Diagnosis Date   GERD (gastroesophageal reflux disease)    Hypertension    Neuromuscular disorder ([place])    femoral nerve severed during hernia surgery; wears right knee brace   Past Surgical History:  Procedure Laterality Date   COLONOSCOPY WITH PROPOFOL N/A 11/05/2021   Procedure: COLONOSCOPY WITH PROPOFOL;  Surgeon: Eloise Harman, DO;  [facility]: [facility];  Service: Endoscopy;  Laterality: N/A;  11:00 ASA 2   HERNIA REPAIR  POLYPECTOMY  11/05/2021   Procedure: POLYPECTOMY;  Surgeon: Eloise Harman, DO;  Location: [facility];  Service: Endoscopy;;   Family History  Problem Relation Age of Onset   Colon cancer Neg Hx    Breast cancer Neg Hx    Social History   Socioeconomic History   Marital status: Divorced    Spouse name: Not on file   Number of children: 0   Years of education: Not on file   Highest education level: Not on file  Occupational History   Not on file  Tobacco Use   Smoking status: Never   Smokeless tobacco: Never  Vaping Use    Vaping Use: Never used  Substance and Sexual Activity   Alcohol use: Yes    Alcohol/week: 1.0 standard drink of alcohol    Types: 1 Glasses of wine per week    Comment: occaisional    Drug use: Never   Sexual activity: Yes    Birth control/protection: Post-menopausal  Other Topics Concern   Not on file  Social History Narrative   Lives alone but pt states she has a current boyfriend and close friends and neighbors.   Social Determinants of Health   Financial Resource Strain: Low Risk  (01/09/2021)   Overall Financial Resource Strain (CARDIA)    Difficulty of Paying Living Expenses: Not hard at all  Food Insecurity: No Food Insecurity (01/09/2021)   Hunger Vital Sign    Worried About Running Out of Food in the Last Year: Never true    Ran Out of Food in the Last Year: Never true  Transportation Needs: No Transportation Needs (01/09/2021)   PRAPARE - Hydrologist (Medical): No    Lack of Transportation (Non-Medical): No  Physical Activity: Inactive (01/09/2021)   Exercise Vital Sign    Days of Exercise per Week: 0 days    Minutes of Exercise per Session: 0 min  Stress: No Stress Concern Present (01/09/2021)   [place]    Feeling of Stress : Not at all  Social Connections: Moderately Isolated (01/09/2021)   Social Connection and Isolation Panel [NHANES]    Frequency of Communication with Friends and Family: More than three times a week    Frequency of Social Gatherings with Friends and Family: More than three times a week    Attends Religious Services: Never    Marine scientist or Organizations: Yes    Attends Archivist Meetings: Never    Marital Status: Divorced    Tobacco Counseling Counseling given: Not Answered   Clinical Intake:  Pre-visit preparation completed: Yes  Pain : No/denies pain     Nutritional Status: BMI <19  Underweight Diabetes:  No  How often do you need to have someone help you when you read instructions, pamphlets, or other written materials from your doctor or pharmacy?: 1 - Never What is the last grade level you completed in school?: some college  Diabetic?No     Activities of Daily Living     No data to display          Patient Care Team: Johnette Abraham, MD as PCP - General (Internal Medicine) Eloise Harman, DO as Consulting Physician (Gastroenterology)  Indicate any recent Medical Services you may have received from other than [facility] providers in the past year (date may be approximate).     Assessment:   This is a routine wellness examination for  Michelle Jackson.  Hearing/Vision screen No results found.  Dietary issues and exercise activities discussed:     Goals Addressed   None    Depression Screen    04/01/2021    3:42 PM 01/09/2021    2:57 PM 12/05/2020    8:32 AM 10/01/2020    1:40 PM 09/11/2020   10:31 AM 07/18/2020    9:06 AM 06/13/2020    8:41 AM  PHQ 2/9 Scores  PHQ - 2 Score 0 0 1 0 0 3 1  PHQ- 9 Score     2 8     Fall Risk    01/21/2022    1:15 PM 07/31/2021   10:04 AM 04/01/2021    3:40 PM 02/28/2021    4:10 PM 01/09/2021    3:03 PM  Fall Risk   Falls in the past year? 0 0 0 0 0  Number falls in past yr: 0 0 0 0 0  Injury with Fall? 0 0 0 0 0  Risk for fall due to :  No Fall Risks No Fall Risks  Impaired mobility  Follow up  Falls evaluation completed Falls evaluation completed  Falls prevention discussed    FALL RISK PREVENTION PERTAINING TO THE HOME:  Any stairs in or around the home? No  If so, are there any without handrails? No  Home free of loose throw rugs in walkways, pet beds, electrical cords, etc? Yes  Adequate lighting in your home to reduce risk of falls? Yes   ASSISTIVE DEVICES UTILIZED TO PREVENT FALLS:  Life alert? No  Use of a cane, walker or w/c? No  Grab bars in the bathroom? Yes  Shower chair or bench in shower? Yes  Elevated toilet seat or  a handicapped toilet? No     Cognitive Function:    07/18/2020    9:23 AM  MMSE - Mini Mental State Exam  Orientation to time 4  Orientation to Place 5  Registration 3  Attention/ Calculation 5  Recall 2  Language- name 2 objects 2  Language- repeat 1  Language- follow 3 step command 3  Language- read & follow direction 1  Write a sentence 1  Copy design 1  Total score 28        01/21/2022    1:17 PM  6CIT Screen  What Year? 0 points  What month? 0 points  What time? 0 points  Count back from 20 0 points  Months in reverse 4 points  Repeat phrase 0 points  Total Score 4 points    Immunizations Immunization History  Administered Date(s) Administered   Fluad Quad(high Dose 65+) 11/29/2015, 12/20/2017, 12/02/2020   Influenza Split 12/29/2011, 11/25/2013   Influenza, High Dose Seasonal PF 12/05/2012, 12/12/2014, 12/01/2016, 12/20/2017, 12/20/2018, 11/23/2019   Influenza,inj,quad, With Preservative 12/05/2012   Influenza-Unspecified 01/08/2011   Moderna Covid-19 Vaccine Bivalent Booster 33yr & up 12/21/2020   Moderna Sars-Covid-2 Vaccination 05/14/2019, 06/11/2019, 01/13/2020, 06/24/2020   Pneumococcal Conjugate-13 12/09/2013   Pneumococcal Polysaccharide-23 04/14/2012, 01/21/2017   Rsv, Bivalent, Protein Subunit Rsvpref,pf (Evans Lance 12/14/2021   Tdap 08/17/2003, 11/16/2015   Zoster Recombinat (Shingrix) 06/02/2017, 02/14/2019   Zoster, Live 11/16/2007    TDAP status: Up to date  Flu Vaccine status: Up to date  Pneumococcal vaccine status: Up to date  Covid-19 vaccine status: Completed vaccines  Qualifies for Shingles Vaccine? Yes   Zostavax completed Yes   Shingrix Completed?: Yes  Screening Tests Health Maintenance  Topic Date Due   COVID-19 Vaccine (6 -  Moderna risk series) 02/15/2021   INFLUENZA VACCINE  10/22/2021   Medicare Annual Wellness (AWV)  01/09/2022   TETANUS/TDAP  11/15/2025   COLONOSCOPY (Pts 45-69yr Insurance coverage will need to  be confirmed)  11/06/2026   Pneumonia Vaccine 75 Years old  Completed   DEXA SCAN  Completed   Hepatitis C Screening  Completed   Zoster Vaccines- Shingrix  Completed   HPV VACCINES  Aged Out    Health Maintenance  Health Maintenance Due  Topic Date Due   COVID-19 Vaccine (6 - Moderna risk series) 02/15/2021   INFLUENZA VACCINE  10/22/2021   Medicare Annual Wellness (AWV)  01/09/2022    Colorectal cancer screening: Type of screening: Colonoscopy. Completed 11/05/2021. Repeat every 5 years  Mammogram status: Completed 10/02/2021. Repeat every year  Bone Density status: Completed 07/31/2021. Results reflect: Bone density results: OSTEOPENIA.   Lung Cancer Screening: (Low Dose CT Chest recommended if Age 75-80years, 30 pack-year currently smoking OR have quit w/in 15years.) does not qualify.   Additional Screening:  Hepatitis C Screening: does not qualify; Completed 07/31/2021  Vision Screening: Recommended annual ophthalmology exams for early detection of glaucoma and other disorders of the eye. Is the patient up to date with their annual eye exam?  Yes  Who is the provider or what is the name of the office in which the patient attends annual eye exams? [facility] If pt is not established with a provider, would they like to be referred to a provider to establish care? No .   Dental Screening: Recommended annual dental exams for proper oral hygiene  Community Resource Referral / Chronic Care Management: CRR required this visit?  No   CCM required this visit?  No      Plan:     I have personally reviewed and noted the following in the patient's chart:   Medical and social history Use of alcohol, tobacco or illicit drugs  Current medications and supplements including opioid prescriptions. Patient is not currently taking opioid prescriptions. Functional ability and status Nutritional status Physical activity Advanced directives List of other physicians Hospitalizations,  surgeries, and ER visits in previous 12 months Vitals Screenings to include cognitive, depression, and falls Referrals and appointments  In addition, I have reviewed and discussed with patient certain preventive protocols, quality metrics, and best practice recommendations. A written personalized care plan for preventive services as well as general preventive health recommendations were provided to patient.     JLorene Dy MD   01/21/2022

## 2022-01-21 NOTE — Patient Instructions (Signed)
  Michelle Jackson , Thank you for taking time to come for your Medicare Wellness Visit. I appreciate your ongoing commitment to your health goals. Please review the following plan we discussed and let me know if I can assist you in the future.   These are the goals we discussed:  Goals      Have 3 meals a day        This is a list of the screening recommended for you and due dates:  Health Maintenance  Topic Date Due   COVID-19 Vaccine (6 - Moderna risk series) 02/14/2022   Medicare Annual Wellness Visit  01/22/2023   Tetanus Vaccine  11/15/2025   Colon Cancer Screening  11/06/2026   Pneumonia Vaccine  Completed   Flu Shot  Completed   DEXA scan (bone density measurement)  Completed   Hepatitis C Screening: USPSTF Recommendation to screen - Ages 70-79 yo.  Completed   Zoster (Shingles) Vaccine  Completed   HPV Vaccine  Aged Out

## 2022-01-31 ENCOUNTER — Encounter: Payer: Self-pay | Admitting: Internal Medicine

## 2022-01-31 ENCOUNTER — Ambulatory Visit: Payer: PPO | Admitting: Nurse Practitioner

## 2022-01-31 ENCOUNTER — Ambulatory Visit (INDEPENDENT_AMBULATORY_CARE_PROVIDER_SITE_OTHER): Payer: PPO | Admitting: Internal Medicine

## 2022-01-31 ENCOUNTER — Ambulatory Visit: Payer: PPO | Admitting: Family Medicine

## 2022-01-31 VITALS — BP 130/70 | HR 73 | Ht 60.0 in | Wt 83.4 lb

## 2022-01-31 DIAGNOSIS — R7303 Prediabetes: Secondary | ICD-10-CM | POA: Diagnosis not present

## 2022-01-31 DIAGNOSIS — K219 Gastro-esophageal reflux disease without esophagitis: Secondary | ICD-10-CM

## 2022-01-31 DIAGNOSIS — M8580 Other specified disorders of bone density and structure, unspecified site: Secondary | ICD-10-CM

## 2022-01-31 DIAGNOSIS — E782 Mixed hyperlipidemia: Secondary | ICD-10-CM

## 2022-01-31 DIAGNOSIS — I10 Essential (primary) hypertension: Secondary | ICD-10-CM

## 2022-01-31 NOTE — Assessment & Plan Note (Signed)
A1c 5.7 in May.  Counseled on limiting fatty/fried foods.

## 2022-01-31 NOTE — Assessment & Plan Note (Signed)
Symptoms well controlled with daily PPI

## 2022-01-31 NOTE — Assessment & Plan Note (Signed)
BP 153/77 today.  Her current regimen includes amlodipine 5 mg daily and valsartan 320 mg daily.  She endorses significant anxiety today and states that her blood pressure is typically better. -Through shared decision making, she is in agreement with follow-up in 1 month for BP check.  No medication changes today.  I have asked that she keep a blood pressure log in the interim to review her next appointment.

## 2022-01-31 NOTE — Assessment & Plan Note (Signed)
Lipid panel updated in May.  She is currently on fish oil supplementation.  Plan for repeat lipid panel in spring 2024.

## 2022-01-31 NOTE — Assessment & Plan Note (Signed)
DEXA scan updated 2019.  She is currently on calcium and vitamin D supplementation. -Plan for repeat DEXA in 2024

## 2022-01-31 NOTE — Progress Notes (Signed)
Established Patient Office Visit  Subjective   Patient ID: Michelle Jackson, female    DOB: 04-Oct-1945  Age: 76 y.o. MRN: [id number]  Chief Complaint  Patient presents with   Follow-up   Michelle Jackson returns to care today.  She is a 76 year old woman with a past medical history significant for HTN, osteopenia, HLD, GERD, and prediabetes.  She was last seen at [facility] on 5/10 by Vena Rua, NP for her annual exam.  In the interim, she has undergone screening colonoscopy and has completed her AWV. Michelle Jackson reports feeling well today.  She has no acute concerns to discuss and is asymptomatic.  Chronic medical conditions and outstanding preventative care items reviewed today are individually addressed in A/P below.  Past Medical History:  Diagnosis Date   GERD (gastroesophageal reflux disease)    Hypertension    Neuromuscular disorder ([place])    femoral nerve severed during hernia surgery; wears right knee brace   Past Surgical History:  Procedure Laterality Date   COLONOSCOPY WITH PROPOFOL N/A 11/05/2021   Procedure: COLONOSCOPY WITH PROPOFOL;  Surgeon: Eloise Harman, DO;  [facility];  Service: Endoscopy;  Laterality: N/A;  11:00 ASA 2   HERNIA REPAIR     POLYPECTOMY  11/05/2021   Procedure: POLYPECTOMY;  Surgeon: Eloise Harman, DO;  [facility];  Service: Endoscopy;;   Social History   Tobacco Use   Smoking status: Never   Smokeless tobacco: Never  Vaping Use   Vaping Use: Never used  Substance Use Topics   Alcohol use: Yes    Alcohol/week: 1.0 standard drink of alcohol    Types: 1 Glasses of wine per week    Comment: occaisional    Drug use: Never   Family History  Problem Relation Age of Onset   Colon cancer Neg Hx    Breast cancer Neg Hx    Allergies  Allergen Reactions   Iodinated Contrast Media Hives   Other Hives    IVP Dye     Tape Other (See Comments)    Pt states she does better with paper tape   Review of Systems   Constitutional:  Negative for chills and fever.  HENT:  Negative for sore throat.   Respiratory:  Negative for cough and shortness of breath.   Cardiovascular:  Negative for chest pain, palpitations and leg swelling.  Gastrointestinal:  Negative for abdominal pain, blood in stool, constipation, diarrhea, nausea and vomiting.  Genitourinary:  Negative for dysuria and hematuria.  Musculoskeletal:  Negative for myalgias.  Skin:  Negative for itching and rash.  Neurological:  Negative for dizziness and headaches.  Psychiatric/Behavioral:  Negative for depression and suicidal ideas.      Objective:     BP 130/70   Pulse 73   Ht 5' (1.524 m)   Wt 83 lb 6.4 oz (37.8 kg)   LMP  (LMP Unknown)   SpO2 95%   BMI 16.29 kg/m  BP Readings from Last 3 Encounters:  01/31/22 130/70  11/05/21 (!) 93/42  07/31/21 130/64   Physical Exam Vitals reviewed.  Constitutional:      General: She is not in acute distress.    Appearance: Normal appearance. She is not toxic-appearing.  HENT:     Head: Normocephalic and atraumatic.     Right Ear: External ear normal.     Left Ear: External ear normal.     Nose: Nose normal. No congestion or rhinorrhea.  Mouth/Throat:     Mouth: Mucous membranes are moist.     Pharynx: Oropharynx is clear. No oropharyngeal exudate or posterior oropharyngeal erythema.  Eyes:     General: No scleral icterus.    Extraocular Movements: Extraocular movements intact.     Conjunctiva/sclera: Conjunctivae normal.     Pupils: Pupils are equal, round, and reactive to light.  Cardiovascular:     Rate and Rhythm: Normal rate and regular rhythm.     Pulses: Normal pulses.     Heart sounds: Normal heart sounds.  Pulmonary:     Effort: Pulmonary effort is normal.     Breath sounds: Normal breath sounds.  Abdominal:     General: Abdomen is flat. Bowel sounds are normal. There is no distension.     Palpations: Abdomen is soft.     Tenderness: There is no abdominal  tenderness.  Musculoskeletal:        General: Normal range of motion.     Cervical back: Normal range of motion.     Comments: Brace on right knee  Skin:    General: Skin is warm and dry.     Capillary Refill: Capillary refill takes less than 2 seconds.     Coloration: Skin is not jaundiced.  Neurological:     General: No focal deficit present.     Mental Status: She is alert and oriented to person, place, and time.     Comments: Periodically repeats herself  Psychiatric:        Mood and Affect: Mood normal.        Behavior: Behavior normal.    Last CBC Lab Results  Component Value Date   WBC 4.5 03/28/2021   HGB 13.2 03/28/2021   HCT 39.3 03/28/2021   MCV 97 03/28/2021   MCH 32.7 03/28/2021   RDW 12.8 03/28/2021   PLT 273 03/28/2021   Last metabolic panel Lab Results  Component Value Date   GLUCOSE 97 07/26/2021   NA 140 07/26/2021   K 4.3 07/26/2021   CL 103 07/26/2021   CO2 26 07/26/2021   BUN 18 07/26/2021   CREATININE 0.69 07/26/2021   EGFR 91 07/26/2021   CALCIUM 9.7 07/26/2021   PROT 6.4 07/26/2021   ALBUMIN 4.2 07/26/2021   LABGLOB 2.2 07/26/2021   AGRATIO 1.9 07/26/2021   BILITOT 0.9 07/26/2021   ALKPHOS 56 07/26/2021   AST 26 07/26/2021   ALT 19 07/26/2021   Last lipids Lab Results  Component Value Date   CHOL 198 07/26/2021   HDL 84 07/26/2021   LDLCALC 102 (H) 07/26/2021   TRIG 67 07/26/2021   CHOLHDL 2.4 07/26/2021   Last hemoglobin A1c Lab Results  Component Value Date   HGBA1C 5.7 (H) 07/31/2021   Last thyroid functions Lab Results  Component Value Date   TSH 0.984 07/26/2021   Last vitamin D Lab Results  Component Value Date   VD25OH 74.2 07/26/2021   Last vitamin B12 and Folate Lab Results  Component Value Date   VITAMINB12 1,023 07/18/2020    The 10-year ASCVD risk score (Arnett DK, et al., 2019) is: 21.8%    Assessment & Plan:   Problem List Items Addressed This Visit       Hypertension, essential - Primary     BP 153/77 initially, 130/70 on repeat.  Her current regimen includes amlodipine 5 mg daily and valsartan 320 mg daily.  She endorses significant anxiety today and states that her blood pressure is typically better. -Through shared   decision making, she is in agreement with follow-up in 1 month for BP check.  No medication changes today.  I have asked that she keep a blood pressure log in the interim to review her next appointment.      Esophageal reflux    Symptoms well controlled with daily PPI      Osteopenia with high risk of fracture    DEXA scan updated 2019.  She is currently on calcium and vitamin D supplementation. -Plan for repeat DEXA in 2024      Hyperlipidemia    Lipid panel updated in May.  She is currently on fish oil supplementation.  Plan for repeat lipid panel in spring 2024.      Prediabetes    A1c 5.7 in May.  Counseled on limiting fatty/fried foods.       Return in about 4 weeks (around 02/28/2022) for BP check.     E , MD  

## 2022-01-31 NOTE — Patient Instructions (Signed)
It was a pleasure to see you today.  Thank you for giving [place] the opportunity to be involved in your care.  Below is a brief recap of your visit and next steps.  We will plan to see you again in 1 month.  Summary No medication changes today. We will plan follow up for 1 month. In the interim I would like for you to check your blood pressure 2-3 times weekly for [place] to review at your next appointment.

## 2022-02-26 ENCOUNTER — Other Ambulatory Visit: Payer: Self-pay | Admitting: Nurse Practitioner

## 2022-02-28 ENCOUNTER — Ambulatory Visit (INDEPENDENT_AMBULATORY_CARE_PROVIDER_SITE_OTHER): Payer: PPO | Admitting: Internal Medicine

## 2022-02-28 ENCOUNTER — Encounter: Payer: Self-pay | Admitting: Internal Medicine

## 2022-02-28 VITALS — BP 139/71 | HR 68 | Ht 60.0 in | Wt 82.8 lb

## 2022-02-28 DIAGNOSIS — I10 Essential (primary) hypertension: Secondary | ICD-10-CM | POA: Diagnosis not present

## 2022-02-28 DIAGNOSIS — R0989 Other specified symptoms and signs involving the circulatory and respiratory systems: Secondary | ICD-10-CM | POA: Diagnosis not present

## 2022-02-28 MED ORDER — AMLODIPINE BESYLATE 10 MG PO TABS: 10.0000 mg | ORAL_TABLET | Freq: Every day | ORAL | 2 refills | Status: DC

## 2022-02-28 NOTE — Patient Instructions (Signed)
It was a pleasure to see you today.  Thank you for giving [place] the opportunity to be involved in your care.  Below is a brief recap of your visit and next steps.  We will plan to see you again in 1 month.  Summary Increase amlodipine to 10 mg daily and continue valsartan 320 mg daily Follow up in 1 month for blood pressure check  I recommend using a saline rinse followed by fluticasone nasal spray for your upper respiratory symptoms. You would also benefit from taking a daily antihistamine such as cetirizine. Chloraseptic spray can help with relieving your throat irritation.

## 2022-02-28 NOTE — Progress Notes (Unsigned)
Established Patient Office Visit  Subjective   Patient ID: Michelle Jackson, female    DOB: 1945-10-05  Age: 76 y.o. MRN: [id number]  Chief Complaint  Patient presents with   Hypertension    Follow up   Michelle Jackson returns to care today for HTN follow-up.  She was last seen by me on 11/10 at which time her blood pressure was elevated.  No medication changes were made and 1 month follow-up was arranged for BP check.  There have been no acute interval events. Today Michelle Jackson endorses nasal congestion and throat irritation.  Her symptoms have been present x 2 days.  Her nasal secretions have been clear.  She has not been afebrile and denies any sick contacts.  She is interested in symptomatic treatment recommendations.  Past Medical History:  Diagnosis Date   GERD (gastroesophageal reflux disease)    Hypertension    Neuromuscular disorder ([place])    femoral nerve severed during hernia surgery; wears right knee brace   Past Surgical History:  Procedure Laterality Date   COLONOSCOPY WITH PROPOFOL N/A 11/05/2021   Procedure: COLONOSCOPY WITH PROPOFOL;  Surgeon: Eloise Harman, DO;  [facility];  Service: Endoscopy;  Laterality: N/A;  11:00 ASA 2   HERNIA REPAIR     POLYPECTOMY  11/05/2021   Procedure: POLYPECTOMY;  Surgeon: Eloise Harman, DO;  [facility];  Service: Endoscopy;;   Social History   Tobacco Use   Smoking status: Never   Smokeless tobacco: Never  Vaping Use   Vaping Use: Never used  Substance Use Topics   Alcohol use: Yes    Alcohol/week: 1.0 standard drink of alcohol    Types: 1 Glasses of wine per week    Comment: occaisional    Drug use: Never   Family History  Problem Relation Age of Onset   Colon cancer Neg Hx    Breast cancer Neg Hx    Allergies  Allergen Reactions   Iodinated Contrast Media Hives   Other Hives    IVP Dye     Tape Other (See Comments)    Pt states she does better with paper tape   Review of Systems   Constitutional:  Negative for chills, fever and malaise/fatigue.  HENT:  Positive for congestion, sinus pain and sore throat.   Respiratory:  Negative for cough, sputum production and shortness of breath.   All other systems reviewed and are negative.    Objective:     BP 139/71   Pulse 68   Ht 5' (1.524 m)   Wt 82 lb 12.8 oz (37.6 kg)   LMP  (LMP Unknown)   SpO2 95%   BMI 16.17 kg/m  BP Readings from Last 3 Encounters:  02/28/22 139/71  01/31/22 130/70  11/05/21 (!) 93/42   Physical Exam Vitals reviewed.  Constitutional:      General: She is not in acute distress.    Appearance: Normal appearance. She is not toxic-appearing.  HENT:     Head: Normocephalic and atraumatic.     Right Ear: External ear normal.     Left Ear: External ear normal.     Nose: Congestion present. No rhinorrhea.     Mouth/Throat:     Mouth: Mucous membranes are moist.     Pharynx: Oropharynx is clear. Posterior oropharyngeal erythema present. No oropharyngeal exudate.  Eyes:     General: No scleral icterus.    Extraocular Movements: Extraocular movements intact.  Conjunctiva/sclera: Conjunctivae normal.     Pupils: Pupils are equal, round, and reactive to light.  Cardiovascular:     Rate and Rhythm: Normal rate and regular rhythm.     Pulses: Normal pulses.     Heart sounds: Normal heart sounds.  Pulmonary:     Effort: Pulmonary effort is normal.     Breath sounds: Normal breath sounds.  Abdominal:     General: Abdomen is flat. Bowel sounds are normal. There is no distension.     Palpations: Abdomen is soft.     Tenderness: There is no abdominal tenderness.  Musculoskeletal:        General: Normal range of motion.     Cervical back: Normal range of motion.     Comments: Brace on right knee  Skin:    General: Skin is warm and dry.     Capillary Refill: Capillary refill takes less than 2 seconds.     Coloration: Skin is not jaundiced.  Neurological:     General: No focal deficit  present.     Mental Status: She is alert and oriented to person, place, and time.     Comments: Frequently repeats herself  Psychiatric:        Mood and Affect: Mood normal.        Behavior: Behavior normal.    Last CBC Lab Results  Component Value Date   WBC 4.5 03/28/2021   HGB 13.2 03/28/2021   HCT 39.3 03/28/2021   MCV 97 03/28/2021   MCH 32.7 03/28/2021   RDW 12.8 03/28/2021   PLT 273 19/14/7829   Last metabolic panel Lab Results  Component Value Date   GLUCOSE 97 07/26/2021   NA 140 07/26/2021   K 4.3 07/26/2021   CL 103 07/26/2021   CO2 26 07/26/2021   BUN 18 07/26/2021   CREATININE 0.69 07/26/2021   EGFR 91 07/26/2021   CALCIUM 9.7 07/26/2021   PROT 6.4 07/26/2021   ALBUMIN 4.2 07/26/2021   LABGLOB 2.2 07/26/2021   AGRATIO 1.9 07/26/2021   BILITOT 0.9 07/26/2021   ALKPHOS 56 07/26/2021   AST 26 07/26/2021   ALT 19 07/26/2021   Last lipids Lab Results  Component Value Date   CHOL 198 07/26/2021   HDL 84 07/26/2021   LDLCALC 102 (H) 07/26/2021   TRIG 67 07/26/2021   CHOLHDL 2.4 07/26/2021   Last hemoglobin A1c Lab Results  Component Value Date   HGBA1C 5.7 (H) 07/31/2021   Last thyroid functions Lab Results  Component Value Date   TSH 0.984 07/26/2021   Last vitamin D Lab Results  Component Value Date   VD25OH 74.2 07/26/2021   Last vitamin B12 and Folate Lab Results  Component Value Date   VITAMINB12 1,023 07/18/2020   The 10-year ASCVD risk score (Arnett DK, et al., 2019) is: 24.5%    Assessment & Plan:   Problem List Items Addressed This Visit       Hypertension, essential - Primary    She is currently prescribed amlodipine 5 mg daily and valsartan 320 mg daily for treatment of hypertension.  Her blood pressure today is 139/71. -Through shared decision making, we will increase amlodipine to 10 mg daily for improved HTN control.  Continue valsartan 320 mg daily. -Follow-up in 1 month for HTN      Symptoms of upper respiratory  infection (URI)    Today she endorses nasal congestion with clear secretions as well as throat irritation.  We discussed supportive treatment options  for now, including use of saline rinse followed by fluticasone nasal spray.  I have also recommended that she take a daily antihistamine.  She can also use Sudafed for nasal decongestion if she develops sinus pain/pressure.  We additionally discussed use of Chloraseptic spray for relief of throat irritation.       Return in about 4 weeks (around 03/28/2022) for HTN.    Johnette Abraham, MD

## 2022-03-05 DIAGNOSIS — R0989 Other specified symptoms and signs involving the circulatory and respiratory systems: Secondary | ICD-10-CM | POA: Insufficient documentation

## 2022-03-05 NOTE — Assessment & Plan Note (Addendum)
Today she endorses nasal congestion with clear secretions as well as throat irritation.  We discussed supportive treatment options for now, including use of saline rinse followed by fluticasone nasal spray.  I have also recommended that she take a daily antihistamine.  She can also use Sudafed for nasal decongestion if she develops sinus pain/pressure.  We additionally discussed use of Chloraseptic spray for relief of throat irritation.

## 2022-03-05 NOTE — Assessment & Plan Note (Signed)
She is currently prescribed amlodipine 5 mg daily and valsartan 320 mg daily for treatment of hypertension.  Her blood pressure today is 139/71. -Through shared decision making, we will increase amlodipine to 10 mg daily for improved HTN control.  Continue valsartan 320 mg daily. -Follow-up in 1 month for HTN

## 2022-03-11 ENCOUNTER — Ambulatory Visit: Payer: PPO | Admitting: Internal Medicine

## 2022-03-28 ENCOUNTER — Encounter: Payer: Self-pay | Admitting: Internal Medicine

## 2022-03-28 ENCOUNTER — Ambulatory Visit (INDEPENDENT_AMBULATORY_CARE_PROVIDER_SITE_OTHER): Payer: PPO | Admitting: Internal Medicine

## 2022-03-28 VITALS — BP 114/67 | HR 62 | Ht 62.0 in | Wt 81.4 lb

## 2022-03-28 DIAGNOSIS — I10 Essential (primary) hypertension: Secondary | ICD-10-CM

## 2022-03-28 DIAGNOSIS — S39012A Strain of muscle, fascia and tendon of lower back, initial encounter: Secondary | ICD-10-CM

## 2022-03-28 NOTE — Assessment & Plan Note (Addendum)
She is currently prescribed amlodipine 10 mg daily and valsartan 320 mg daily for treatment of hypertension.  Amlodipine was increased at her last appointment.  Her blood pressure in office today is 114/67.  Home readings show systolic pressures consistently 110-120 mmHg. -No additional medication changes indicated today

## 2022-03-28 NOTE — Patient Instructions (Signed)
It was a pleasure to see you today.  Thank you for giving [place] the opportunity to be involved in your care.  Below is a brief recap of your visit and next steps.  We will plan to see you again in 4 months.  Summary No medication changes today. Continue amlodipine 10 mg daily and valsartan 320 mg daily. Your blood pressure looks great.  We will follow up in 4 months for your annual exam

## 2022-03-28 NOTE — Progress Notes (Signed)
Established Patient Office Visit  Subjective   Patient ID: Michelle Jackson, female    DOB: May 15, 1945  Age: 77 y.o. MRN: [id number]  Chief Complaint  Patient presents with   Hypertension    Follow up   Michelle Jackson returns to care today for HTN follow-up.  She was last seen by me on 12/8 at which time amlodipine was increased to 10 mg daily.  4-week follow-up was arranged.  There have been no acute interval events. Today Michelle Jackson states that she feels well.  She endorses mild left lower back discomfort that she is treating with NSAIDs and a heating pad.  She has been checking her blood pressure routinely at home.  Systolic readings are consistently 110s-120s mmHg.  Past Medical History:  Diagnosis Date   GERD (gastroesophageal reflux disease)    Hypertension    Neuromuscular disorder ([place])    femoral nerve severed during hernia surgery; wears right knee brace   Past Surgical History:  Procedure Laterality Date   COLONOSCOPY WITH PROPOFOL N/A 11/05/2021   Procedure: COLONOSCOPY WITH PROPOFOL;  Surgeon: Eloise Harman, DO;  [facility];  Service: Endoscopy;  Laterality: N/A;  11:00 ASA 2   HERNIA REPAIR     POLYPECTOMY  11/05/2021   Procedure: POLYPECTOMY;  Surgeon: Eloise Harman, DO;  [facility];  Service: Endoscopy;;   Social History   Tobacco Use   Smoking status: Never   Smokeless tobacco: Never  Vaping Use   Vaping Use: Never used  Substance Use Topics   Alcohol use: Yes    Alcohol/week: 1.0 standard drink of alcohol    Types: 1 Glasses of wine per week    Comment: occaisional    Drug use: Never   Family History  Problem Relation Age of Onset   Colon cancer Neg Hx    Breast cancer Neg Hx    Allergies  Allergen Reactions   Iodinated Contrast Media Hives   Other Hives    IVP Dye     Tape Other (See Comments)    Pt states she does better with paper tape   Review of Systems  Musculoskeletal:  Positive for back pain (Left lumbar  region).  All other systems reviewed and are negative.    Objective:     BP 114/67   Pulse 62   Ht '5\' 2"'$  (1.575 m)   Wt 81 lb 6.4 oz (36.9 kg)   LMP  (LMP Unknown)   SpO2 96%   BMI 14.89 kg/m  BP Readings from Last 3 Encounters:  03/28/22 114/67  02/28/22 139/71  01/31/22 130/70   Physical Exam Vitals reviewed.  Constitutional:      General: She is not in acute distress.    Appearance: Normal appearance. She is not toxic-appearing.  HENT:     Head: Normocephalic and atraumatic.     Right Ear: External ear normal.     Left Ear: External ear normal.     Nose: Nose normal. No congestion or rhinorrhea.     Mouth/Throat:     Mouth: Mucous membranes are moist.     Pharynx: Oropharynx is clear. No oropharyngeal exudate or posterior oropharyngeal erythema.  Eyes:     General: No scleral icterus.    Extraocular Movements: Extraocular movements intact.     Conjunctiva/sclera: Conjunctivae normal.     Pupils: Pupils are equal, round, and reactive to light.  Cardiovascular:     Rate and Rhythm: Normal rate and regular  rhythm.     Pulses: Normal pulses.     Heart sounds: Normal heart sounds.  Pulmonary:     Effort: Pulmonary effort is normal.     Breath sounds: Normal breath sounds.  Abdominal:     General: Abdomen is flat. Bowel sounds are normal. There is no distension.     Palpations: Abdomen is soft.     Tenderness: There is no abdominal tenderness.  Musculoskeletal:        General: Normal range of motion.     Cervical back: Normal range of motion.     Comments: Brace on right knee  Skin:    General: Skin is warm and dry.     Capillary Refill: Capillary refill takes less than 2 seconds.     Coloration: Skin is not jaundiced.  Neurological:     General: No focal deficit present.     Mental Status: She is alert and oriented to person, place, and time.  Psychiatric:        Mood and Affect: Mood normal.        Behavior: Behavior normal.    Last CBC Lab Results   Component Value Date   WBC 4.5 03/28/2021   HGB 13.2 03/28/2021   HCT 39.3 03/28/2021   MCV 97 03/28/2021   MCH 32.7 03/28/2021   RDW 12.8 03/28/2021   PLT 273 16/96/7893   Last metabolic panel Lab Results  Component Value Date   GLUCOSE 97 07/26/2021   NA 140 07/26/2021   K 4.3 07/26/2021   CL 103 07/26/2021   CO2 26 07/26/2021   BUN 18 07/26/2021   CREATININE 0.69 07/26/2021   EGFR 91 07/26/2021   CALCIUM 9.7 07/26/2021   PROT 6.4 07/26/2021   ALBUMIN 4.2 07/26/2021   LABGLOB 2.2 07/26/2021   AGRATIO 1.9 07/26/2021   BILITOT 0.9 07/26/2021   ALKPHOS 56 07/26/2021   AST 26 07/26/2021   ALT 19 07/26/2021   Last lipids Lab Results  Component Value Date   CHOL 198 07/26/2021   HDL 84 07/26/2021   LDLCALC 102 (H) 07/26/2021   TRIG 67 07/26/2021   CHOLHDL 2.4 07/26/2021   Last hemoglobin A1c Lab Results  Component Value Date   HGBA1C 5.7 (H) 07/31/2021   Last thyroid functions Lab Results  Component Value Date   TSH 0.984 07/26/2021   Last vitamin D Lab Results  Component Value Date   VD25OH 74.2 07/26/2021   Last vitamin B12 and Folate Lab Results  Component Value Date   VITAMINB12 1,023 07/18/2020   The 10-year ASCVD risk score (Arnett DK, et al., 2019) is: 17.2%    Assessment & Plan:   Problem List Items Addressed This Visit       Hypertension, essential - Primary    She is currently prescribed amlodipine 10 mg daily and valsartan 320 mg daily for treatment of hypertension.  Amlodipine was increased at her last appointment.  Her blood pressure in office today is 114/67.  Home readings show systolic pressures consistently 110-120 mmHg. -No additional medication changes indicated today      Lumbar strain    Today she endorses mild left lower back discomfort.  No red flag symptoms identified.  She has been managing her symptoms with a heating pad and NSAIDs.  I have additionally provided her with home PT exercises.       Return in about 4  months (around 07/27/2022) for CPE, repeat labs.    Johnette Abraham, MD

## 2022-03-28 NOTE — Assessment & Plan Note (Signed)
Today she endorses mild left lower back discomfort.  No red flag symptoms identified.  She has been managing her symptoms with a heating pad and NSAIDs.  I have additionally provided her with home PT exercises.

## 2022-03-31 ENCOUNTER — Telehealth: Payer: Self-pay | Admitting: Internal Medicine

## 2022-03-31 ENCOUNTER — Other Ambulatory Visit: Payer: Self-pay | Admitting: Internal Medicine

## 2022-03-31 DIAGNOSIS — I10 Essential (primary) hypertension: Secondary | ICD-10-CM

## 2022-03-31 NOTE — Telephone Encounter (Signed)
Pt called stating she thought her medication was supposed to be increase. Can you please look into this?   If so can you please send to phar. Pt states they have not received this medication.

## 2022-04-01 NOTE — Telephone Encounter (Signed)
Returned patient call.

## 2022-04-24 ENCOUNTER — Telehealth: Payer: Self-pay | Admitting: Internal Medicine

## 2022-04-24 ENCOUNTER — Other Ambulatory Visit: Payer: Self-pay

## 2022-04-24 MED ORDER — PANTOPRAZOLE SODIUM 40 MG PO TBEC: 40.0000 mg | DELAYED_RELEASE_TABLET | Freq: Every day | ORAL | 0 refills | Status: DC

## 2022-04-24 NOTE — Telephone Encounter (Signed)
Prescription Request  04/24/2022  Is this a "Controlled Substance" medicine? No  LOV: 03/28/2022  What is the name of the medication or equipment? pantoprazole (PROTONIX) 40 MG tablet [[id number]]   Have you contacted your pharmacy to request a refill? No   Which pharmacy would you like this sent to?  [id number] [place] [address] [address]Phone: [phone]: [phone]   Patient notified that their request is being sent to the clinical staff for review and that they should receive a response within 2 business days.   Please advise at Mobile [phone](mobile)

## 2022-04-24 NOTE — Telephone Encounter (Signed)
Refill sent to pharmacy.   

## 2022-04-24 NOTE — Telephone Encounter (Signed)
Patient called, requesting a refill for Pantoprazole '40mg'$  to be sent to [facility].  Pt's # [phone]

## 2022-05-28 ENCOUNTER — Other Ambulatory Visit: Payer: Self-pay

## 2022-05-28 ENCOUNTER — Telehealth: Payer: Self-pay | Admitting: Internal Medicine

## 2022-05-28 DIAGNOSIS — I10 Essential (primary) hypertension: Secondary | ICD-10-CM

## 2022-05-28 MED ORDER — AMLODIPINE BESYLATE 10 MG PO TABS: 10.0000 mg | ORAL_TABLET | Freq: Every day | ORAL | 1 refills | Status: DC

## 2022-05-28 NOTE — Telephone Encounter (Signed)
Prescription Request  05/28/2022  LOV: 03/28/2022  What is the name of the medication or equipment? amLODipine (NORVASC) 10 MG tablet   Have you contacted your pharmacy to request a refill? No   Which pharmacy would you like this sent to?  [id number] [place] [address] [address]Phone: [phone]: [phone]   Patient notified that their request is being sent to the clinical staff for review and that they should receive a response within 2 business days.   [facility] at [phone]

## 2022-05-28 NOTE — Telephone Encounter (Signed)
Refilled

## 2022-06-28 ENCOUNTER — Other Ambulatory Visit: Payer: Self-pay | Admitting: Family Medicine

## 2022-06-28 DIAGNOSIS — I10 Essential (primary) hypertension: Secondary | ICD-10-CM

## 2022-07-28 ENCOUNTER — Encounter: Payer: Self-pay | Admitting: Internal Medicine

## 2022-07-28 ENCOUNTER — Ambulatory Visit (INDEPENDENT_AMBULATORY_CARE_PROVIDER_SITE_OTHER): Payer: PPO | Admitting: Internal Medicine

## 2022-07-28 VITALS — BP 117/68 | HR 55 | Ht 60.0 in | Wt 82.8 lb

## 2022-07-28 DIAGNOSIS — K219 Gastro-esophageal reflux disease without esophagitis: Secondary | ICD-10-CM | POA: Diagnosis not present

## 2022-07-28 DIAGNOSIS — E559 Vitamin D deficiency, unspecified: Secondary | ICD-10-CM

## 2022-07-28 DIAGNOSIS — I10 Essential (primary) hypertension: Secondary | ICD-10-CM

## 2022-07-28 DIAGNOSIS — Z0001 Encounter for general adult medical examination with abnormal findings: Secondary | ICD-10-CM

## 2022-07-28 DIAGNOSIS — M810 Age-related osteoporosis without current pathological fracture: Secondary | ICD-10-CM

## 2022-07-28 DIAGNOSIS — R7303 Prediabetes: Secondary | ICD-10-CM | POA: Diagnosis not present

## 2022-07-28 DIAGNOSIS — Z1329 Encounter for screening for other suspected endocrine disorder: Secondary | ICD-10-CM

## 2022-07-28 DIAGNOSIS — E782 Mixed hyperlipidemia: Secondary | ICD-10-CM | POA: Diagnosis not present

## 2022-07-28 NOTE — Assessment & Plan Note (Signed)
Presenting today for her annual exam.  Recent records and labs have been reviewed. -Baseline labs repeated today -Vaccines and cancer screenings are up-to-date -We will tentatively plan for follow-up in 6 months

## 2022-07-28 NOTE — Progress Notes (Signed)
Complete physical exam  Patient: Michelle Jackson   DOB: 07/30/1945   77 y.o. Female  MRN: [id number]  Subjective:    Chief Complaint  Patient presents with   Annual Exam    DUSTINE LAPORTA is a 77 y.o. female who presents today for a complete physical exam. She reports consuming a general diet. The patient does not participate in regular exercise at present. She generally feels fairly well. She reports sleeping fairly well. She does not have additional problems to discuss today.    Most recent fall risk assessment:    07/28/2022   11:08 AM  Fall Risk   Falls in the past year? 0  Number falls in past yr: 0  Injury with Fall? 0  Risk for fall due to : No Fall Risks  Follow up Falls evaluation completed     Most recent depression screenings:    07/28/2022   11:08 AM 03/28/2022   11:38 AM  PHQ 2/9 Scores  PHQ - 2 Score 0 0  PHQ- 9 Score 0 0    Vision:Not within last year  and Dental: No current dental problems and Receives regular dental care  Past Medical History:  Diagnosis Date   GERD (gastroesophageal reflux disease)    Hypertension    Neuromuscular disorder (HCC)    femoral nerve severed during hernia surgery; wears right knee brace   Past Surgical History:  Procedure Laterality Date   COLONOSCOPY WITH PROPOFOL N/A 11/05/2021   Procedure: COLONOSCOPY WITH PROPOFOL;  Surgeon: Lanelle Bal, DO;  [facility];  Service: Endoscopy;  Laterality: N/A;  11:00 ASA 2   HERNIA REPAIR     POLYPECTOMY  11/05/2021   Procedure: POLYPECTOMY;  Surgeon: Lanelle Bal, DO;  Location: [facility];  Service: Endoscopy;;   Social History   Tobacco Use   Smoking status: Never   Smokeless tobacco: Never  Vaping Use   Vaping Use: Never used  Substance Use Topics   Alcohol use: Yes    Alcohol/week: 1.0 standard drink of alcohol    Types: 1 Glasses of wine per week    Comment: occaisional    Drug use: Never   Family History  Problem Relation Age of Onset    Colon cancer Neg Hx    Breast cancer Neg Hx    Allergies  Allergen Reactions   Iodinated Contrast Media Hives   Other Hives    IVP Dye     Tape Other (See Comments)    Pt states she does better with paper tape      Patient Care Team: Billie Lade, MD as PCP - General (Internal Medicine) Lanelle Bal, DO as Consulting Physician (Gastroenterology)   Outpatient Medications Prior to Visit  Medication Sig   amLODipine (NORVASC) 10 MG tablet Take 1 tablet (10 mg total) by mouth daily.   Apoaequorin (PREVAGEN PO) Take 1 tablet by mouth daily.   Calcium Carbonate Antacid (CALCIUM CARBONATE PO) Take 1,200 mg by mouth daily.   Calcium Carbonate-Vit D-Min (CALCIUM 1200 PO) Take 600 mg by mouth 2 (two) times daily. Magnesium  Zinc Copper Manganese sulfate   Cholecalciferol (VITAMIN D) 50 MCG (2000 UT) CAPS Take 2,000 Units by mouth daily.   Ginkgo Biloba 120 MG TABS Take 120 mg by mouth daily.   Glycerin-Hypromellose-PEG 400 (DRY EYE RELIEF DROPS OP) Place 1 drop into both eyes daily as needed (Dry eyes).   Lysine 1000 MG TABS Take 1,000 mg by  mouth daily.   Magnesium 200 MG TABS Take 400 mg by mouth daily. Gummy   Melatonin 10 MG TABS Take 20 mg by mouth at bedtime. Gummy   Multiple Vitamin (MULTIVITAMIN) tablet Take 1 tablet by mouth daily. Woman's 50+ With Iron   Omega-3 Fatty Acids (FISH OIL) 1200 MG CAPS Take 1,200 mg by mouth 2 (two) times daily. 2 daily   pantoprazole (PROTONIX) 40 MG tablet Take 1 tablet (40 mg total) by mouth daily.   valsartan (DIOVAN) 320 MG tablet TAKE 1 TABLET BY MOUTH EVERY DAY   No facility-administered medications prior to visit.   Review of Systems  Constitutional:  Negative for chills and fever.  HENT:  Negative for sore throat.   Respiratory:  Negative for cough and shortness of breath.   Cardiovascular:  Negative for chest pain, palpitations and leg swelling.  Gastrointestinal:  Negative for abdominal pain, blood in stool, constipation,  diarrhea, nausea and vomiting.  Genitourinary:  Negative for dysuria and hematuria.  Musculoskeletal:  Negative for myalgias.  Skin:  Negative for itching and rash.  Neurological:  Negative for dizziness and headaches.  Psychiatric/Behavioral:  Negative for depression and suicidal ideas.       Objective:     BP 117/68   Pulse (!) 55   Ht 5' (1.524 m)   Wt 82 lb 12.8 oz (37.6 kg)   LMP  (LMP Unknown)   SpO2 98%   BMI 16.17 kg/m  BP Readings from Last 3 Encounters:  07/28/22 117/68  03/28/22 114/67  02/28/22 139/71   Physical Exam Vitals reviewed.  Constitutional:      General: She is not in acute distress.    Appearance: Normal appearance. She is not toxic-appearing.  HENT:     Head: Normocephalic and atraumatic.     Right Ear: External ear normal.     Left Ear: External ear normal.     Nose: Nose normal. No congestion or rhinorrhea.     Mouth/Throat:     Mouth: Mucous membranes are moist.     Pharynx: Oropharynx is clear. No oropharyngeal exudate or posterior oropharyngeal erythema.  Eyes:     General: No scleral icterus.    Extraocular Movements: Extraocular movements intact.     Conjunctiva/sclera: Conjunctivae normal.     Pupils: Pupils are equal, round, and reactive to light.  Cardiovascular:     Rate and Rhythm: Normal rate and regular rhythm.     Pulses: Normal pulses.     Heart sounds: Normal heart sounds.  Pulmonary:     Effort: Pulmonary effort is normal.     Breath sounds: Normal breath sounds.  Abdominal:     General: Abdomen is flat. Bowel sounds are normal. There is no distension.     Palpations: Abdomen is soft.     Tenderness: There is no abdominal tenderness.  Musculoskeletal:        General: Normal range of motion.     Cervical back: Normal range of motion.     Comments: Brace on right knee  Skin:    General: Skin is warm and dry.     Capillary Refill: Capillary refill takes less than 2 seconds.     Coloration: Skin is not jaundiced.   Neurological:     General: No focal deficit present.     Mental Status: She is alert and oriented to person, place, and time.  Psychiatric:        Mood and Affect: Mood normal.  Behavior: Behavior normal.    Last CBC Lab Results  Component Value Date   WBC 4.5 03/28/2021   HGB 13.2 03/28/2021   HCT 39.3 03/28/2021   MCV 97 03/28/2021   MCH 32.7 03/28/2021   RDW 12.8 03/28/2021   PLT 273 03/28/2021   Last metabolic panel Lab Results  Component Value Date   GLUCOSE 97 07/26/2021   NA 140 07/26/2021   K 4.3 07/26/2021   CL 103 07/26/2021   CO2 26 07/26/2021   BUN 18 07/26/2021   CREATININE 0.69 07/26/2021   EGFR 91 07/26/2021   CALCIUM 9.7 07/26/2021   PROT 6.4 07/26/2021   ALBUMIN 4.2 07/26/2021   LABGLOB 2.2 07/26/2021   AGRATIO 1.9 07/26/2021   BILITOT 0.9 07/26/2021   ALKPHOS 56 07/26/2021   AST 26 07/26/2021   ALT 19 07/26/2021   Last lipids Lab Results  Component Value Date   CHOL 198 07/26/2021   HDL 84 07/26/2021   LDLCALC 102 (H) 07/26/2021   TRIG 67 07/26/2021   CHOLHDL 2.4 07/26/2021   Last hemoglobin A1c Lab Results  Component Value Date   HGBA1C 5.7 (H) 07/31/2021   Last thyroid functions Lab Results  Component Value Date   TSH 0.984 07/26/2021   Last vitamin D Lab Results  Component Value Date   VD25OH 74.2 07/26/2021   Last vitamin B12 and Folate Lab Results  Component Value Date   VITAMINB12 1,023 07/18/2020        Assessment & Plan:    Routine Health Maintenance and Physical Exam  Immunization History  Administered Date(s) Administered   Fluad Quad(high Dose 65+) 11/29/2015, 12/20/2017, 12/02/2020, 12/14/2021   Influenza Split 12/29/2011, 11/25/2013   Influenza, High Dose Seasonal PF 12/05/2012, 12/12/2014, 12/01/2016, 12/20/2017, 12/20/2018, 11/23/2019   Influenza,inj,quad, With Preservative 12/05/2012   Influenza-Unspecified 01/08/2011   Moderna Covid-19 Vaccine Bivalent Booster 70yrs & up 12/21/2020   Moderna  SARS-COV2 Booster Vaccination 12/20/2021   Moderna Sars-Covid-2 Vaccination 05/14/2019, 06/11/2019, 01/13/2020, 06/24/2020   Pneumococcal Conjugate-13 12/09/2013   Pneumococcal Polysaccharide-23 04/14/2012, 01/21/2017   Rsv, Bivalent, Protein Subunit Rsvpref,pf Verdis Frederickson) 12/14/2021   Tdap 08/17/2003, 11/16/2015   Zoster Recombinat (Shingrix) 06/02/2017, 02/14/2019   Zoster, Live 11/16/2007    Health Maintenance  Topic Date Due   COVID-19 Vaccine (6 - 2023-24 season) 02/14/2022   INFLUENZA VACCINE  10/23/2022   Medicare Annual Wellness (AWV)  01/22/2023   DTaP/Tdap/Td (3 - Td or Tdap) 11/15/2025   COLONOSCOPY (Pts 45-6yrs Insurance coverage will need to be confirmed)  11/06/2026   Pneumonia Vaccine 53+ Years old  Completed   DEXA SCAN  Completed   Hepatitis C Screening  Completed   Zoster Vaccines- Shingrix  Completed   HPV VACCINES  Aged Out    Discussed health benefits of physical activity, and encouraged her to engage in regular exercise appropriate for her age and condition.  Problem List Items Addressed This Visit       Hypertension, essential    Remains well-controlled with amlodipine 10 mg daily and valsartan 320 mg daily. -No medication changes today      Esophageal reflux    Symptoms remain well-controlled Protonix 40 mg daily. -No medication changes today      Encounter for well adult exam with abnormal findings - Primary    Presenting today for her annual exam.  Recent records and labs have been reviewed. -Baseline labs repeated today -Vaccines and cancer screenings are up-to-date -We will tentatively plan for follow-up in 6 months  Return in about 6 months (around 01/28/2023).  Billie Lade, MD

## 2022-07-28 NOTE — Assessment & Plan Note (Signed)
Remains well-controlled with amlodipine 10 mg daily and valsartan 320 mg daily. -No medication changes today

## 2022-07-28 NOTE — Patient Instructions (Signed)
It was a pleasure to see you today.  Thank you for giving [place] the opportunity to be involved in your care.  Below is a brief recap of your visit and next steps.  We will plan to see you again in 6 months.  Summary We completed your annual exam today No medication changes have been repeat Repeat labs ordered Follow up in 6 months

## 2022-07-28 NOTE — Assessment & Plan Note (Signed)
Symptoms remain well-controlled Protonix 40 mg daily. -No medication changes today

## 2022-07-29 ENCOUNTER — Telehealth: Payer: Self-pay | Admitting: Internal Medicine

## 2022-07-29 LAB — TSH+FREE T4
Free T4: 1.57 ng/dL (ref 0.82–1.77)
TSH: 1.03 u[IU]/mL (ref 0.450–4.500)

## 2022-07-29 LAB — CBC WITH DIFFERENTIAL/PLATELET
Basophils Absolute: 0 10*3/uL (ref 0.0–0.2)
Basos: 1 %
EOS (ABSOLUTE): 0.1 10*3/uL (ref 0.0–0.4)
Eos: 3 %
Hematocrit: 36.9 % (ref 34.0–46.6)
Hemoglobin: 12 g/dL (ref 11.1–15.9)
Immature Grans (Abs): 0 10*3/uL (ref 0.0–0.1)
Immature Granulocytes: 0 %
Lymphocytes Absolute: 1.5 10*3/uL (ref 0.7–3.1)
Lymphs: 31 %
MCH: 31 pg (ref 26.6–33.0)
MCHC: 32.5 g/dL (ref 31.5–35.7)
MCV: 95 fL (ref 79–97)
Monocytes Absolute: 0.4 10*3/uL (ref 0.1–0.9)
Monocytes: 9 %
Neutrophils Absolute: 2.6 10*3/uL (ref 1.4–7.0)
Neutrophils: 56 %
Platelets: 272 10*3/uL (ref 150–450)
RBC: 3.87 x10E6/uL (ref 3.77–5.28)
RDW: 12 % (ref 11.7–15.4)
WBC: 4.7 10*3/uL (ref 3.4–10.8)

## 2022-07-29 LAB — CMP14+EGFR
ALT: 19 IU/L (ref 0–32)
AST: 24 IU/L (ref 0–40)
Albumin/Globulin Ratio: 1.7 (ref 1.2–2.2)
Albumin: 4.1 g/dL (ref 3.8–4.8)
Alkaline Phosphatase: 64 IU/L (ref 44–121)
BUN/Creatinine Ratio: 27 (ref 12–28)
BUN: 23 mg/dL (ref 8–27)
Bilirubin Total: 0.4 mg/dL (ref 0.0–1.2)
CO2: 25 mmol/L (ref 20–29)
Calcium: 10.1 mg/dL (ref 8.7–10.3)
Chloride: 102 mmol/L (ref 96–106)
Creatinine, Ser: 0.86 mg/dL (ref 0.57–1.00)
Globulin, Total: 2.4 g/dL (ref 1.5–4.5)
Glucose: 87 mg/dL (ref 70–99)
Potassium: 4.8 mmol/L (ref 3.5–5.2)
Sodium: 140 mmol/L (ref 134–144)
Total Protein: 6.5 g/dL (ref 6.0–8.5)
eGFR: 70 mL/min/{1.73_m2} (ref >59)

## 2022-07-29 LAB — LIPID PANEL
Chol/HDL Ratio: 3.1 ratio (ref 0.0–4.4)
Cholesterol, Total: 202 mg/dL — ABNORMAL HIGH (ref 100–199)
HDL: 66 mg/dL (ref >39)
LDL Chol Calc (NIH): 121 mg/dL — ABNORMAL HIGH (ref 0–99)
Triglycerides: 82 mg/dL (ref 0–149)
VLDL Cholesterol Cal: 15 mg/dL (ref 5–40)

## 2022-07-29 LAB — B12 AND FOLATE PANEL
Folate: >20 ng/mL (ref >3.0)
Vitamin B-12: 1423 pg/mL — ABNORMAL HIGH (ref 232–1245)

## 2022-07-29 LAB — VITAMIN D 25 HYDROXY (VIT D DEFICIENCY, FRACTURES): Vit D, 25-Hydroxy: 104 ng/mL — ABNORMAL HIGH (ref 30.0–100.0)

## 2022-07-29 LAB — HEMOGLOBIN A1C
Est. average glucose Bld gHb Est-mCnc: 126 mg/dL
Hgb A1c MFr Bld: 6 % — ABNORMAL HIGH (ref 4.8–5.6)

## 2022-07-29 NOTE — Telephone Encounter (Signed)
Spoke to patient

## 2022-07-29 NOTE — Telephone Encounter (Signed)
Pt called in regard to med change. Cannot remember which med she was advised to cut back on between vit d and pantoprazole. Wants a call back

## 2022-08-22 ENCOUNTER — Ambulatory Visit (HOSPITAL_COMMUNITY)
Admission: RE | Admit: 2022-08-22 | Discharge: 2022-08-22 | Disposition: A | Discharge status: Home / Self Care | Payer: PPO | Source: Ambulatory Visit | Attending: Internal Medicine | Admitting: Internal Medicine

## 2022-08-22 ENCOUNTER — Encounter: Payer: Self-pay | Admitting: Internal Medicine

## 2022-08-22 ENCOUNTER — Ambulatory Visit (INDEPENDENT_AMBULATORY_CARE_PROVIDER_SITE_OTHER): Payer: PPO | Admitting: Internal Medicine

## 2022-08-22 VITALS — BP 107/51 | HR 59 | Ht 60.0 in | Wt 80.2 lb

## 2022-08-22 DIAGNOSIS — M79671 Pain in right foot: Secondary | ICD-10-CM | POA: Diagnosis not present

## 2022-08-22 DIAGNOSIS — S99921A Unspecified injury of right foot, initial encounter: Secondary | ICD-10-CM | POA: Diagnosis not present

## 2022-08-22 DIAGNOSIS — K219 Gastro-esophageal reflux disease without esophagitis: Secondary | ICD-10-CM

## 2022-08-22 MED ORDER — PANTOPRAZOLE SODIUM 40 MG PO TBEC: 40.0000 mg | DELAYED_RELEASE_TABLET | Freq: Two times a day (BID) | ORAL | 0 refills | Status: DC

## 2022-08-22 NOTE — Assessment & Plan Note (Signed)
Symptoms have previously been well-controlled with Protonix 40 mg daily, however she reports that her symptoms are recently worsened.   -Recommend increasing Protonix to 40 mg twice daily -She states that she also plans to schedule follow-up with her gastroenterologist, Dr. Jena Gauss.

## 2022-08-22 NOTE — Progress Notes (Signed)
   Acute Office Visit  Subjective:     Patient ID: Michelle Jackson, female    DOB: 1945-07-07, 77 y.o.   MRN: [id number]  Chief Complaint  Patient presents with   Ankle Injury    Right foot. Sprained on 08/11/22. Hard to walk on, no color changes or swelling. Problems with leg and back.   Michelle Jackson presents today for an acute visit endorsing right lateral foot pain.  She reports falling close to 2 weeks ago while getting up to use the bathroom overnight.  She fell onto her right leg and has experienced pain in the lateral aspect of the right foot since that time.  Pain is worsened with ambulation.  She denies weakness, and numbness/tingling in the right lower extremity.  She has tried managing her pain with Tylenol and ibuprofen.  Review of Systems  Gastrointestinal:  Positive for heartburn.  Musculoskeletal:  Positive for joint pain (Right foot/ankle pain).  All other systems reviewed and are negative.     Objective:    BP (!) 107/51   Pulse (!) 59   Ht 5' (1.524 m)   Wt 80 lb 3.2 oz (36.4 kg)   LMP  (LMP Unknown)   SpO2 98%   BMI 15.66 kg/m   Physical Exam Musculoskeletal:     Comments: No obvious deformity on inspection of the right ankle.  There is minimal tenderness to palpation over the lateral malleolus and ATFL.  Active ROM is intact, however pain is elicited with resisted eversion and dorsiflexion of the right foot.       Assessment & Plan:   Problem List Items Addressed This Visit       Esophageal reflux    Symptoms have previously been well-controlled with Protonix 40 mg daily, however she reports that her symptoms are recently worsened.   -Recommend increasing Protonix to 40 mg twice daily -She states that she also plans to schedule follow-up with her gastroenterologist, Dr. Jena Gauss.      Acute foot pain, right    Presenting today for evaluation of right foot/lateral ankle pain in the setting of recent fall.  She fell while ambulating to the bathroom in the  middle of the night recently.  Since that time she is experienced pain in the lateral portion of her right foot.  No obvious deformity is present on exam.  ROM is generally intact, however pain is elicited with resisted eversion and dorsiflexion.  Pain is exacerbated by walking and prolonged periods of standing. -Right foot x-rays ordered today to exclude fracture, particularly base of the fifth metatarsal fracture. -Recommended that she wear supportive footwear instead of sandals/dress shoes.  I also recommended that she purchase a lace up ankle brace. -Okay to continue as needed use of Tylenol/ibuprofen for pain relief -She was provided with home PT exercises -Further management pending x-ray results.  She was instructed return to care if her symptoms worsen or fail to improve.      Meds ordered this encounter  Medications   pantoprazole (PROTONIX) 40 MG tablet    Sig: Take 1 tablet (40 mg total) by mouth 2 (two) times daily.    Dispense:  90 tablet    Refill:  0    Return if symptoms worsen or fail to improve.  Billie Lade, MD

## 2022-08-22 NOTE — Patient Instructions (Signed)
It was a pleasure to see you today.  Thank you for giving [place] the opportunity to be involved in your care.  Below is a brief recap of your visit and next steps.  We will plan to see you again in November.  Summary Right ankle xrays ordered today I recommend purchasing a lace up ankle brace and wearing tennis shoes to help with healing Please see the attached exercises for your foot Increase Protonix to twice daily and follow up with Dr. Kendell Bane.

## 2022-08-22 NOTE — Assessment & Plan Note (Signed)
Presenting today for evaluation of right foot/lateral ankle pain in the setting of recent fall.  She fell while ambulating to the bathroom in the middle of the night recently.  Since that time she is experienced pain in the lateral portion of her right foot.  No obvious deformity is present on exam.  ROM is generally intact, however pain is elicited with resisted eversion and dorsiflexion.  Pain is exacerbated by walking and prolonged periods of standing. -Right foot x-rays ordered today to exclude fracture, particularly base of the fifth metatarsal fracture. -Recommended that she wear supportive footwear instead of sandals/dress shoes.  I also recommended that she purchase a lace up ankle brace. -Okay to continue as needed use of Tylenol/ibuprofen for pain relief -She was provided with home PT exercises -Further management pending x-ray results.  She was instructed return to care if her symptoms worsen or fail to improve.

## 2022-08-26 ENCOUNTER — Telehealth: Payer: Self-pay | Admitting: Internal Medicine

## 2022-08-26 ENCOUNTER — Other Ambulatory Visit: Payer: Self-pay | Admitting: Internal Medicine

## 2022-08-26 ENCOUNTER — Ambulatory Visit: Payer: PPO | Admitting: Internal Medicine

## 2022-08-26 DIAGNOSIS — S92354A Nondisplaced fracture of fifth metatarsal bone, right foot, initial encounter for closed fracture: Secondary | ICD-10-CM

## 2022-08-26 NOTE — Telephone Encounter (Signed)
Patient calling back needs to speak to nurse about the orthopedic referral that was made. Call back # [phone]

## 2022-08-26 NOTE — Telephone Encounter (Signed)
Spoke with patient about referral 

## 2022-08-27 ENCOUNTER — Ambulatory Visit: Payer: PPO | Admitting: Orthopedic Surgery

## 2022-08-27 ENCOUNTER — Encounter: Payer: Self-pay | Admitting: Orthopedic Surgery

## 2022-08-27 VITALS — BP 110/64 | HR 68 | Ht 60.0 in | Wt 80.0 lb

## 2022-08-27 DIAGNOSIS — S92354A Nondisplaced fracture of fifth metatarsal bone, right foot, initial encounter for closed fracture: Secondary | ICD-10-CM

## 2022-08-27 NOTE — Patient Instructions (Signed)
Okay to bear weight through her heel.  Boot/postop shoe to provide additional stability.  Elevate the foot if swollen  Follow-up in 3 weeks

## 2022-08-27 NOTE — Progress Notes (Signed)
New Patient Visit  Assessment: Michelle Jackson is a 77 y.o. female with the following: 1. Closed nondisplaced fracture of fifth metatarsal bone of right foot, initial encounter  Plan: Michelle Jackson fell and injured the lateral border of her right foot, a little over 2 weeks ago.  There is a minimally displaced fracture at the base of fifth metatarsal.  She has tenderness in this area.  Minimal swelling.  Anticipate that this will continue to improve.  We offered her a walking boot or postop shoe in clinic today, she like to proceed with the short walking boot.  Will continue to weight-bear weight through her heel.  Medications as needed.  She can also use the compression sleeve that she is already purchased.  She will return in 3 weeks for repeat evaluation.  Follow-up: Return in about 3 weeks (around 09/17/2022).  Subjective:  Chief Complaint  Patient presents with   Foot Pain    Right foot pain, DOI 08-10-22. Fall.    History of Present Illness: Michelle Jackson is a 77 y.o. female who presents for evaluation of right foot pain.  Over 2 weeks ago, she fell and injured the right foot.  She had delayed presentation, but was evaluated by Dr. Durwin Nora.  Radiographs obtained prior to clinic today demonstrates a nondisplaced fracture at the base of the fifth metatarsal.  She has been wearing a regular shoe.  She has been taking medications as needed.  She purchased a compression sleeve, and has been wearing this on a regular basis.  No prior injuries to the right foot.  Of note, he has a history of right femoral nerve injury that was sustained during a hernia procedure.  He has limited use of the right thigh muscles, therefore wears a rigid brace to the right knee.  This affects her ability to ambulate, and increase the risk for follow-up.   Review of Systems: No fevers or chills No numbness or tingling No chest pain No shortness of breath No bowel or bladder dysfunction No GI distress No  headaches   Medical History:  Past Medical History:  Diagnosis Date   GERD (gastroesophageal reflux disease)    Hypertension    Neuromuscular disorder (HCC)    femoral nerve severed during hernia surgery; wears right knee brace    Past Surgical History:  Procedure Laterality Date   COLONOSCOPY WITH PROPOFOL N/A 11/05/2021   Procedure: COLONOSCOPY WITH PROPOFOL;  Surgeon: Lanelle Bal, DO;  [facility];  Service: Endoscopy;  Laterality: N/A;  11:00 ASA 2   HERNIA REPAIR     POLYPECTOMY  11/05/2021   Procedure: POLYPECTOMY;  Surgeon: Lanelle Bal, DO;  [facility];  Service: Endoscopy;;    Family History  Problem Relation Age of Onset   Colon cancer Neg Hx    Breast cancer Neg Hx    Social History   Tobacco Use   Smoking status: Never   Smokeless tobacco: Never  Vaping Use   Vaping Use: Never used  Substance Use Topics   Alcohol use: Yes    Alcohol/week: 1.0 standard drink of alcohol    Types: 1 Glasses of wine per week    Comment: occaisional    Drug use: Never    Allergies  Allergen Reactions   Iodinated Contrast Media Hives   Other Hives    IVP Dye     Tape Other (See Comments)    Pt states she does better with paper tape  No outpatient medications have been marked as taking for the 08/27/22 encounter (Office Visit) with Oliver Barre, MD.    Objective: BP 110/64   Pulse 68   Ht 5' (1.524 m)   Wt 80 lb (36.3 kg)   LMP  (LMP Unknown)   BMI 15.62 kg/m   Physical Exam:  General: Elderly female., Alert and oriented., and No acute distress. Gait: Right sided antalgic gait.  Evaluation of the right thigh demonstrates gross atrophy.  He has tenderness to palpation at the base of the fifth metatarsal.  There is some mild swelling in this area.  No ecchymosis.  Minimal pain with inversion and eversion.  Toes warm and well-perfused.  IMAGING: I personally reviewed images previously obtained in clinic  X-rays of the  right foot demonstrates minimally displaced fracture base of fifth metatarsal.  Fracture line appears to be incomplete.   New Medications:  No orders of the defined types were placed in this encounter.     Oliver Barre, MD  08/27/2022 3:05 PM

## 2022-09-01 ENCOUNTER — Ambulatory Visit: Payer: PPO | Admitting: Internal Medicine

## 2022-09-17 ENCOUNTER — Ambulatory Visit (INDEPENDENT_AMBULATORY_CARE_PROVIDER_SITE_OTHER): Payer: PPO | Admitting: Orthopedic Surgery

## 2022-09-17 ENCOUNTER — Other Ambulatory Visit (INDEPENDENT_AMBULATORY_CARE_PROVIDER_SITE_OTHER): Payer: PPO

## 2022-09-17 ENCOUNTER — Encounter: Payer: Self-pay | Admitting: Orthopedic Surgery

## 2022-09-17 DIAGNOSIS — S92354A Nondisplaced fracture of fifth metatarsal bone, right foot, initial encounter for closed fracture: Secondary | ICD-10-CM

## 2022-09-17 DIAGNOSIS — S92354D Nondisplaced fracture of fifth metatarsal bone, right foot, subsequent encounter for fracture with routine healing: Secondary | ICD-10-CM

## 2022-09-17 NOTE — Progress Notes (Signed)
Return patient Visit  Assessment: Michelle Jackson is a 77 y.o. female with the following: 1. Closed nondisplaced fracture of fifth metatarsal bone of right foot, subsequent encounter  Plan: Michelle Jackson sustained a right base of the fifth metatarsal fracture, approximate 1 month ago.  She continues to do well in a postop shoe.  Radiographs obtained in clinic today demonstrate stable alignment, with some callus formation.  She does continue to have tenderness to palpation on physical exam.  Recommend continued use of the postop shoe.  I will see her back in 2 weeks, at which time we will repeat x-rays.  If everything continues to improve, she will be able to transition to a regular shoe at that time.  Follow-up: Return in about 2 weeks (around 10/01/2022).  Subjective:  Chief Complaint  Patient presents with   Follow-up    Recheck on right foot    History of Present Illness: Michelle Jackson is a 77 y.o. female who returns for evaluation of right foot pain.  She fell and sustained an injury to her right foot, approximately 1 month ago.  She has been using a postop shoe to assist with ambulation.  Her pain is getting better.  However, she does continue to have pain in the lateral foot.  No additional injuries are noted.   Review of Systems: No fevers or chills No numbness or tingling No chest pain No shortness of breath No bowel or bladder dysfunction No GI distress No headaches    Objective: LMP  (LMP Unknown)   Physical Exam:  General: Elderly female., Alert and oriented., and No acute distress. Gait: Right sided antalgic gait.  Evaluation of the right thigh demonstrates gross atrophy.  Evaluation of the right foot demonstrates mild swelling over the lateral foot.  There is mild deformity.  She has tenderness to palpation at the base of the fifth metatarsal.  Mild discomfort with inversion of the ankle.  Toes are warm and well-perfused.  IMAGING: I personally ordered and  reviewed the following images  X-rays of the right foot were obtained in clinic today.  These are nonweightbearing views.  These are compared to prior x-rays.  At the base of the fifth metatarsal, there is a fracture, with minimal displacement.  There does appear to be some interval callus formation.  No further subsidence.  No additional injuries are noted.  No bony lesions.  Impression: Stable right base of the fifth metatarsal fracture   New Medications:  No orders of the defined types were placed in this encounter.     Oliver Barre, MD  09/17/2022 11:03 PM

## 2022-09-23 ENCOUNTER — Other Ambulatory Visit: Payer: Self-pay | Admitting: Internal Medicine

## 2022-09-23 ENCOUNTER — Other Ambulatory Visit: Payer: Self-pay

## 2022-09-23 ENCOUNTER — Telehealth: Payer: Self-pay | Admitting: Internal Medicine

## 2022-09-23 MED ORDER — PANTOPRAZOLE SODIUM 40 MG PO TBEC: 40.0000 mg | DELAYED_RELEASE_TABLET | Freq: Every day | ORAL | 0 refills | Status: DC

## 2022-09-23 NOTE — Telephone Encounter (Signed)
Prescription Request  09/23/2022  LOV: 08/22/2022  What is the name of the medication or equipment? pantoprazole (PROTONIX) 40 MG tablet [[id number]]   Was told to take morning and night time.  Have you contacted your pharmacy to request a refill? No   Which pharmacy would you like this sent to?  [id number] - [place], [place] - [address] AT [facility] [address] [place] [address] Phone: [phone] Fax: [phone]    Patient notified that their request is being sent to the clinical staff for review and that they should receive a response within 2 business days.   Please advise at Mobile [phone] (mobile)

## 2022-09-23 NOTE — Telephone Encounter (Signed)
Refill sent.

## 2022-09-24 ENCOUNTER — Other Ambulatory Visit: Payer: Self-pay

## 2022-09-24 MED ORDER — PANTOPRAZOLE SODIUM 40 MG PO TBEC: 40.0000 mg | DELAYED_RELEASE_TABLET | Freq: Two times a day (BID) | ORAL | 0 refills | Status: DC

## 2022-10-01 ENCOUNTER — Other Ambulatory Visit (INDEPENDENT_AMBULATORY_CARE_PROVIDER_SITE_OTHER): Payer: PPO

## 2022-10-01 ENCOUNTER — Encounter: Payer: Self-pay | Admitting: Orthopedic Surgery

## 2022-10-01 ENCOUNTER — Ambulatory Visit (INDEPENDENT_AMBULATORY_CARE_PROVIDER_SITE_OTHER): Payer: PPO | Admitting: Orthopedic Surgery

## 2022-10-01 VITALS — BP 97/56 | HR 62 | Ht 60.0 in | Wt 78.0 lb

## 2022-10-01 DIAGNOSIS — S92354D Nondisplaced fracture of fifth metatarsal bone, right foot, subsequent encounter for fracture with routine healing: Secondary | ICD-10-CM

## 2022-10-01 NOTE — Patient Instructions (Signed)
Okay to transition to a regular shoe.  Anticipate that this will take 1-2 weeks.  You may experience some increase swelling when he wears a regular shoe.  This is normal.  Elevate to help with swelling and pain.

## 2022-10-01 NOTE — Progress Notes (Signed)
Return patient Visit  Assessment: Michelle Jackson is a 77 y.o. female with the following: 1. Closed nondisplaced fracture of fifth metatarsal bone of right foot, subsequent encounter  Plan: Michelle Jackson sustained a right base of the fifth metatarsal fracture, approximately 6 weeks ago.  Pain is improving.  Radiographs are stable.  Okay for her to transition to a regular shoe.  This is outlined in clinic today.  She states her understanding.  I will see her back in 1 month for repeat evaluation.  Follow-up: Return in about 4 weeks (around 10/29/2022).  Subjective:  Chief Complaint  Patient presents with   Foot Pain    Follow up right foot pain, patient states the foot is better and she fell last weekend and been having back pain since     History of Present Illness: Michelle Jackson is a 77 y.o. female who returns for evaluation of right foot pain.  She fell and sustained an injury to her right foot, approximately 6 weeks ago.  She continues to bear weight using a postop shoe.  Pain is improving.  She still has some tenderness.  She reports that she fell last week and, and is now having some pain pain.  No other issues at this time.  Review of Systems: No fevers or chills No numbness or tingling No chest pain No shortness of breath No bowel or bladder dysfunction No GI distress No headaches    Objective: BP (!) 97/56   Pulse 62   Ht 5' (1.524 m)   Wt 78 lb (35.4 kg)   LMP  (LMP Unknown)   BMI 15.23 kg/m   Physical Exam:  General: Elderly female., Alert and oriented., and No acute distress. Gait: Right sided antalgic gait.  Evaluation of the right thigh demonstrates gross atrophy.  Right foot with mild swelling.  Tenderness palpation at the base of fifth metatarsal, extending proximally.  Toes warm and well-perfused.  Sensation intact in the dorsum of the foot.  IMAGING: I personally ordered and reviewed the following images  X-rays right foot were obtained in clinic  today.  Minimally displaced fracture of the base of fifth metatarsal, and remains in stable position.  There is evidence of consolidation since the last visit.  No acute injuries.  No dislocation.  Minimal degenerative changes.  Impression: healing right fifth metatarsal base fracture   New Medications:  No orders of the defined types were placed in this encounter.     Oliver Barre, MD  10/01/2022 2:31 PM

## 2022-10-29 ENCOUNTER — Ambulatory Visit: Payer: PPO | Admitting: Orthopedic Surgery

## 2022-10-29 ENCOUNTER — Other Ambulatory Visit (INDEPENDENT_AMBULATORY_CARE_PROVIDER_SITE_OTHER): Payer: PPO

## 2022-10-29 ENCOUNTER — Encounter: Payer: Self-pay | Admitting: Orthopedic Surgery

## 2022-10-29 ENCOUNTER — Other Ambulatory Visit: Payer: Self-pay

## 2022-10-29 VITALS — BP 114/70 | HR 50 | Ht 61.0 in | Wt 81.2 lb

## 2022-10-29 DIAGNOSIS — S92354D Nondisplaced fracture of fifth metatarsal bone, right foot, subsequent encounter for fracture with routine healing: Secondary | ICD-10-CM

## 2022-10-29 NOTE — Progress Notes (Signed)
Return patient Visit  Assessment: Michelle Jackson is a 77 y.o. female with the following: 1. Closed nondisplaced fracture of fifth metatarsal bone of right foot, subsequent encounter  Plan: SHAHADA KROTZ sustained a right base of the fifth metatarsal fracture, over 2 months ago.  She was initially in a postop shoe, but has transition to a regular shoe.  She is doing well at this point.  Encouraged her to continue ambulating, to help continue to strengthen the bone.  Anticipate gradual improvement in aches and pains.  She may notice some swelling moving forward.  Medication as needed.  Elevate the foot to help with swelling.  She will follow-up as needed.  Follow-up: Return if symptoms worsen or fail to improve.  Subjective:  Chief Complaint  Patient presents with   Foot Pain    R/it is about the same.     History of Present Illness: Michelle Jackson is a 77 y.o. female who returns for evaluation of right foot pain.  She fell and sustained an injury to her right foot, approximately 2 months ago.  She has transitioned out of the postop shoe.  She is wearing a regular shoe.  She is doing well.  No pain.  She is ambulating without difficulty.  Review of Systems: No fevers or chills No numbness or tingling No chest pain No shortness of breath No bowel or bladder dysfunction No GI distress No headaches    Objective: BP 114/70   Pulse (!) 50   Ht 5\' 1"  (1.549 m)   Wt 81 lb 4 oz (36.9 kg)   LMP  (LMP Unknown)   BMI 15.35 kg/m   Physical Exam:  General: Elderly female., Alert and oriented., and No acute distress. Gait: Right sided antalgic gait.  Right thigh with atrophy.  Right foot without swelling.  Tenderness to palpation is minimal over the lateral border of the foot.  No pain with resisted inversion or eversion of the right ankle.  Toes warm well-perfused.  Sensation intact over the dorsum of the foot.  IMAGING: I personally ordered and reviewed the following  images  X-rays of the right foot were obtained in clinic today.  Minimally displaced fracture of the base of fifth metatarsal remains in stable position.  Has been interval consolidation.  No further displacement.  There is good callus formation.  No change in overall alignment.  No bony lesions.  Impression: Healed right fifth metatarsal base fracture   New Medications:  No orders of the defined types were placed in this encounter.     Oliver Barre, MD  10/29/2022 1:59 PM

## 2022-12-30 ENCOUNTER — Telehealth: Payer: Self-pay | Admitting: Internal Medicine

## 2022-12-30 ENCOUNTER — Other Ambulatory Visit: Payer: Self-pay

## 2022-12-30 MED ORDER — PANTOPRAZOLE SODIUM 40 MG PO TBEC: 40.0000 mg | DELAYED_RELEASE_TABLET | Freq: Two times a day (BID) | ORAL | 0 refills | Status: DC

## 2022-12-30 NOTE — Telephone Encounter (Signed)
Patient called need med refill fixing to go out of town for 4 weeks need refill. Per patient taking bid.  pantoprazole (PROTONIX) Soddr 40 MG tablet       Pharmacy  [facility] [phone] - [place], [place] - [address] AT [facility] [address], [place] [address] Phone: [phone]  Fax: [phone] DEA #: [id number]

## 2022-12-30 NOTE — Telephone Encounter (Signed)
Refill sent to pharmacy.   

## 2023-01-01 ENCOUNTER — Other Ambulatory Visit: Payer: Self-pay | Admitting: Internal Medicine

## 2023-01-01 ENCOUNTER — Telehealth: Payer: Self-pay | Admitting: Internal Medicine

## 2023-01-01 DIAGNOSIS — I10 Essential (primary) hypertension: Secondary | ICD-10-CM

## 2023-01-01 NOTE — Telephone Encounter (Signed)
error 

## 2023-01-01 NOTE — Telephone Encounter (Signed)
Patient calling says [facility] does not have her [facility]- says she is going out of town tomorrow and needs it before she leaves. Please advise patient when done. Thank you

## 2023-01-01 NOTE — Telephone Encounter (Signed)
Spoke with patient.

## 2023-01-01 NOTE — Telephone Encounter (Signed)
Spoke with patient, advised that it was refilled two days ago.

## 2023-01-01 NOTE — Telephone Encounter (Signed)
Prescription mixed up on her Pantoprazole  patient leaving for [place] of state for few weeks. Patient asking for nurse to return her call ASAP. [phone]

## 2023-01-20 ENCOUNTER — Telehealth: Payer: Self-pay | Admitting: Internal Medicine

## 2023-01-20 NOTE — Telephone Encounter (Signed)
Patient called asking does patient need to a bone density she is saying to be done at her home. Can nurse return her call at [phone]

## 2023-01-20 NOTE — Telephone Encounter (Signed)
Spoke to patient

## 2023-01-21 ENCOUNTER — Telehealth: Payer: Self-pay | Admitting: Internal Medicine

## 2023-01-21 NOTE — Telephone Encounter (Signed)
Spoke to patient yesterday about bone density

## 2023-01-21 NOTE — Telephone Encounter (Signed)
Pt called in wants to know if needs Bone density  Had orders in that were cancelled   Has appt on 11/6  Pt wants a cll back

## 2023-01-28 ENCOUNTER — Ambulatory Visit (INDEPENDENT_AMBULATORY_CARE_PROVIDER_SITE_OTHER): Payer: PPO | Admitting: Internal Medicine

## 2023-01-28 ENCOUNTER — Encounter: Payer: Self-pay | Admitting: Internal Medicine

## 2023-01-28 VITALS — BP 103/60 | HR 75 | Ht 60.0 in | Wt 80.6 lb

## 2023-01-28 DIAGNOSIS — K219 Gastro-esophageal reflux disease without esophagitis: Secondary | ICD-10-CM

## 2023-01-28 DIAGNOSIS — M8580 Other specified disorders of bone density and structure, unspecified site: Secondary | ICD-10-CM | POA: Diagnosis not present

## 2023-01-28 DIAGNOSIS — E782 Mixed hyperlipidemia: Secondary | ICD-10-CM

## 2023-01-28 DIAGNOSIS — R7303 Prediabetes: Secondary | ICD-10-CM

## 2023-01-28 DIAGNOSIS — I10 Essential (primary) hypertension: Secondary | ICD-10-CM | POA: Diagnosis not present

## 2023-01-28 NOTE — Assessment & Plan Note (Signed)
Lipid panel updated in May.  Total cholesterol 202 and LDL 121.  ASCVD risk is intermediate.  She remains on fish oil supplementation and will attempt to make dietary changes in an effort to improve her cholesterol.

## 2023-01-28 NOTE — Assessment & Plan Note (Signed)
She remains on vitamin D and calcium supplementation.  Repeat DEXA at next follow-up appointment.

## 2023-01-28 NOTE — Assessment & Plan Note (Signed)
A1c 6.0 on labs from May.  Dietary changes aimed at lowering her blood sugar were reviewed today.  She endorses that there is significant room for improvement.

## 2023-01-28 NOTE — Assessment & Plan Note (Signed)
 Remains adequately controlled on current antihypertensive regimen.  No medication changes are indicated today.

## 2023-01-28 NOTE — Assessment & Plan Note (Signed)
Symptoms remain intermittently controlled with Protonix 40 mg twice daily.  She describes difficulty with solid foods, such as red meat.  She previously reported that she plans to return to follow-up with her gastroenterologist, Dr. Jena Gauss, but did not make an appointment.  I recommended that she contact Dr. Luvenia Starch office to schedule follow-up given persistence of symptoms despite increasing PPI frequency.

## 2023-01-28 NOTE — Patient Instructions (Signed)
It was a pleasure to see you today.  Thank you for giving [place] the opportunity to be involved in your care.  Below is a brief recap of your visit and next steps.  We will plan to see you again in 6 months.  Summary No medication changes today I recommend following up with Dr. Jena Gauss as previously discussed. Follow up in 6 months

## 2023-01-28 NOTE — Progress Notes (Signed)
Established Patient Office Visit  Subjective   Patient ID: Michelle Jackson, female    DOB: March 27, 1945  Age: 77 y.o. MRN: [id number]  Chief Complaint  Patient presents with   Hypertension    Six month follow up    Michelle Jackson returns to care today for routine follow-up.  She was last evaluated by me on 5/31 for an acute visit endorsing right lateral foot pain and poorly controlled symptoms of reflux.  Protonix was increased to 40 mg twice daily.  Right foot x-rays were obtained and revealed 1/5 metatarsal fracture.  She was referred to orthopedic surgery and evaluated by Dr. Dallas Schimke.  There have otherwise been no acute interval events.  Michelle Jackson reports feeling well today.  She endorses chronic lumbar back pain but is otherwise asymptomatic and has no acute concerns to discuss.    Past Medical History:  Diagnosis Date   GERD (gastroesophageal reflux disease)    Hypertension    Neuromuscular disorder (HCC)    femoral nerve severed during hernia surgery; wears right knee brace   Past Surgical History:  Procedure Laterality Date   COLONOSCOPY WITH PROPOFOL N/A 11/05/2021   Procedure: COLONOSCOPY WITH PROPOFOL;  Surgeon: Lanelle Bal, DO;  Location: [facility];  Service: Endoscopy;  Laterality: N/A;  11:00 ASA 2   HERNIA REPAIR     POLYPECTOMY  11/05/2021   Procedure: POLYPECTOMY;  Surgeon: Lanelle Bal, DO;  Location: [facility];  Service: Endoscopy;;   Social History   Tobacco Use   Smoking status: Never   Smokeless tobacco: Never  Vaping Use   Vaping status: Never Used  Substance Use Topics   Alcohol use: Yes    Alcohol/week: 1.0 standard drink of alcohol    Types: 1 Glasses of wine per week    Comment: occaisional    Drug use: Never   Family History  Problem Relation Age of Onset   Colon cancer Neg Hx    Breast cancer Neg Hx    Allergies  Allergen Reactions   Iodinated Contrast Media Hives   Other Hives    IVP Dye     Tape Other (See Comments)    Pt  states she does better with paper tape   Review of Systems  Musculoskeletal:  Positive for back pain (Chronic lumbar back pain).  All other systems reviewed and are negative.    Objective:     BP 103/60 (BP Location: Left Arm, Patient Position: Sitting, Cuff Size: Normal)   Pulse 75   Ht 5' (1.524 m)   Wt 80 lb 9.6 oz (36.6 kg)   LMP  (LMP Unknown)   SpO2 97%   BMI 15.74 kg/m  BP Readings from Last 3 Encounters:  01/28/23 103/60  10/29/22 114/70  10/01/22 (!) 97/56   Physical Exam Vitals reviewed.  Constitutional:      General: She is not in acute distress.    Appearance: Normal appearance. She is not toxic-appearing.  HENT:     Head: Normocephalic and atraumatic.     Right Ear: External ear normal.     Left Ear: External ear normal.     Nose: Nose normal. No congestion or rhinorrhea.     Mouth/Throat:     Mouth: Mucous membranes are moist.     Pharynx: Oropharynx is clear. No oropharyngeal exudate or posterior oropharyngeal erythema.  Eyes:     General: No scleral icterus.    Extraocular Movements: Extraocular movements intact.  Conjunctiva/sclera: Conjunctivae normal.     Pupils: Pupils are equal, round, and reactive to light.  Cardiovascular:     Rate and Rhythm: Normal rate and regular rhythm.     Pulses: Normal pulses.     Heart sounds: Normal heart sounds.  Pulmonary:     Effort: Pulmonary effort is normal.     Breath sounds: Normal breath sounds.  Abdominal:     General: Abdomen is flat. Bowel sounds are normal. There is no distension.     Palpations: Abdomen is soft.     Tenderness: There is no abdominal tenderness.  Musculoskeletal:        General: Normal range of motion.     Cervical back: Normal range of motion.     Comments: Brace on right knee  Skin:    General: Skin is warm and dry.     Capillary Refill: Capillary refill takes less than 2 seconds.     Coloration: Skin is not jaundiced.  Neurological:     General: No focal deficit present.      Mental Status: She is alert and oriented to person, place, and time.  Psychiatric:        Mood and Affect: Mood normal.        Behavior: Behavior normal.   Last CBC Lab Results  Component Value Date   WBC 4.7 07/28/2022   HGB 12.0 07/28/2022   HCT 36.9 07/28/2022   MCV 95 07/28/2022   MCH 31.0 07/28/2022   RDW 12.0 07/28/2022   PLT 272 07/28/2022   Last metabolic panel Lab Results  Component Value Date   GLUCOSE 87 07/28/2022   NA 140 07/28/2022   K 4.8 07/28/2022   CL 102 07/28/2022   CO2 25 07/28/2022   BUN 23 07/28/2022   CREATININE 0.86 07/28/2022   EGFR 70 07/28/2022   CALCIUM 10.1 07/28/2022   PROT 6.5 07/28/2022   ALBUMIN 4.1 07/28/2022   LABGLOB 2.4 07/28/2022   AGRATIO 1.7 07/28/2022   BILITOT 0.4 07/28/2022   ALKPHOS 64 07/28/2022   AST 24 07/28/2022   ALT 19 07/28/2022   Last lipids Lab Results  Component Value Date   CHOL 202 (H) 07/28/2022   HDL 66 07/28/2022   LDLCALC 121 (H) 07/28/2022   TRIG 82 07/28/2022   CHOLHDL 3.1 07/28/2022   Last hemoglobin A1c Lab Results  Component Value Date   HGBA1C 6.0 (H) 07/28/2022   Last thyroid functions Lab Results  Component Value Date   TSH 1.030 07/28/2022   Last vitamin D Lab Results  Component Value Date   VD25OH 104.0 (H) 07/28/2022   Last vitamin B12 and Folate Lab Results  Component Value Date   VITAMINB12 1,423 (H) 07/28/2022   FOLATE >20.0 07/28/2022   The 10-year ASCVD risk score (Arnett DK, et al., 2019) is: 15.9%    Assessment & Plan:   Problem List Items Addressed This Visit       Hypertension, essential - Primary    Remains adequately controlled on current antihypertensive regimen.  No medication changes are indicated today.      Esophageal reflux    Symptoms remain intermittently controlled with Protonix 40 mg twice daily.  She describes difficulty with solid foods, such as red meat.  She previously reported that she plans to return to follow-up with her  gastroenterologist, Dr. Jena Gauss, but did not make an appointment.  I recommended that she contact Dr. Luvenia Starch office to schedule follow-up given persistence of symptoms despite increasing PPI  frequency.      Osteopenia with high risk of fracture    She remains on vitamin D and calcium supplementation.  Repeat DEXA at next follow-up appointment.      Hyperlipidemia    Lipid panel updated in May.  Total cholesterol 202 and LDL 121.  ASCVD risk is intermediate.  She remains on fish oil supplementation and will attempt to make dietary changes in an effort to improve her cholesterol.      Prediabetes    A1c 6.0 on labs from May.  Dietary changes aimed at lowering her blood sugar were reviewed today.  She endorses that there is significant room for improvement.      Return in about 6 months (around 07/28/2023) for CPE.   Billie Lade, MD

## 2023-01-30 ENCOUNTER — Encounter: Payer: Self-pay | Admitting: *Deleted

## 2023-01-30 ENCOUNTER — Encounter: Payer: Self-pay | Admitting: Gastroenterology

## 2023-01-30 ENCOUNTER — Ambulatory Visit: Payer: PPO | Admitting: Gastroenterology

## 2023-01-30 VITALS — BP 102/62 | HR 62 | Temp 98.6°F | Ht 60.0 in | Wt 81.2 lb

## 2023-01-30 DIAGNOSIS — R1310 Dysphagia, unspecified: Secondary | ICD-10-CM | POA: Diagnosis not present

## 2023-01-30 DIAGNOSIS — K219 Gastro-esophageal reflux disease without esophagitis: Secondary | ICD-10-CM

## 2023-01-30 NOTE — Progress Notes (Signed)
GI Office Note    Referring Provider: Billie Lade, MD Primary Care Physician:  Billie Lade, MD  Primary Gastroenterologist: Hennie Duos. Marletta Lor, DO   Chief Complaint   Chief Complaint  Patient presents with   Dysphagia    Pt states whenever she eats it feels like it is stuck in the middle of her chest. Mostly meats     History of Present Illness   Michelle Jackson is a 77 y.o. female presenting today for further evaluation of solid food dysphagia.  She has history of mild cognitive impairment. Presents with her significant other of three years. She reports chronic GERD and has been on pantoprazole BID for some time. She is having issues with sensation of food sticking in the lower chest area. Tries to wash down food. Has had vomiting with it. Symptoms mostly with meats but also with cooked vegetables. Unable to eat much before she has to stop. Used to weigh 120 pounds at her heaviest but in the past 2 years her heaviest weight was 91 pounds. BMs range from constipation to diarrhea. Takes Pepto as needed. Denies melena, brbpr. Typical heartburn well controlled.   She states she has history of hiatal hernia, she denies prior EGD.   Colonoscopy August 2023: -Diverticulosis in the sigmoid colon and descending colon -two 1 to 2 mm polyps in the transverse colon removed, tubular adenomas -Surveillance colonoscopy in 5 years  Wt Readings from Last 10 Encounters:  01/30/23 81 lb 3.2 oz (36.8 kg)  01/28/23 80 lb 9.6 oz (36.6 kg)  10/29/22 81 lb 4 oz (36.9 kg)  10/01/22 78 lb (35.4 kg)  08/27/22 80 lb (36.3 kg)  08/22/22 80 lb 3.2 oz (36.4 kg)  07/28/22 82 lb 12.8 oz (37.6 kg)  03/28/22 81 lb 6.4 oz (36.9 kg)  02/28/22 82 lb 12.8 oz (37.6 kg)  01/31/22 83 lb 6.4 oz (37.8 kg)     Medications   Current Outpatient Medications  Medication Sig Dispense Refill   amLODipine (NORVASC) 10 MG tablet TAKE 1 TABLET BY MOUTH EVERY DAY 90 tablet 1   Apoaequorin (PREVAGEN PO)  Take 1 tablet by mouth daily.     Calcium Carbonate Antacid (CALCIUM CARBONATE PO) Take 1,200 mg by mouth daily.     Calcium Carbonate-Vit D-Min (CALCIUM 1200 PO) Take 600 mg by mouth 2 (two) times daily. Magnesium  Zinc Copper Manganese sulfate     Cholecalciferol (VITAMIN D) 50 MCG (2000 UT) CAPS Take 2,000 Units by mouth daily.     Ginkgo Biloba 120 MG TABS Take 120 mg by mouth daily.     Glycerin-Hypromellose-PEG 400 (DRY EYE RELIEF DROPS OP) Place 1 drop into both eyes daily as needed (Dry eyes).     Lysine 1000 MG TABS Take 1,000 mg by mouth daily.     Magnesium 200 MG TABS Take 400 mg by mouth daily. Gummy     Melatonin 10 MG TABS Take 20 mg by mouth at bedtime. Gummy     Multiple Vitamin (MULTIVITAMIN) tablet Take 1 tablet by mouth daily. Woman's 50+ With Iron     Omega-3 Fatty Acids (FISH OIL) 1200 MG CAPS Take 1,200 mg by mouth 2 (two) times daily. 2 daily     pantoprazole (PROTONIX) 40 MG tablet TAKE 1 TABLET BY MOUTH TWICE A DAY 180 tablet 0   valsartan (DIOVAN) 320 MG tablet TAKE 1 TABLET BY MOUTH EVERY DAY 90 tablet 2   No current facility-administered medications for  this visit.    Allergies   Allergies as of 01/30/2023 - Review Complete 01/30/2023  Allergen Reaction Noted   Iodinated contrast media Hives 10/25/2010   Other Hives 12/29/2011   Tape Other (See Comments) 01/22/2011    Past Medical History   Past Medical History:  Diagnosis Date   GERD (gastroesophageal reflux disease)    Hypertension    Neuromuscular disorder (HCC)    femoral nerve severed during hernia surgery; wears right knee brace    Past Surgical History   Past Surgical History:  Procedure Laterality Date   COLONOSCOPY WITH PROPOFOL N/A 11/05/2021   Procedure: COLONOSCOPY WITH PROPOFOL;  Surgeon: Lanelle Bal, DO;  [facility]: [facility];  Service: Endoscopy;  Laterality: N/A;  11:00 ASA 2   HERNIA REPAIR     POLYPECTOMY  11/05/2021   Procedure: POLYPECTOMY;  Surgeon: Lanelle Bal, DO;  Location: [facility];  Service: Endoscopy;;    Past Family History   Family History  Problem Relation Age of Onset   Colon cancer Neg Hx    Breast cancer Neg Hx     Past Social History   Social History   Socioeconomic History   Marital status: Divorced    Spouse name: Not on file   Number of children: 0   Years of education: Not on file   Highest education level: 12th grade  Occupational History   Not on file  Tobacco Use   Smoking status: Never   Smokeless tobacco: Never  Vaping Use   Vaping status: Never Used  Substance and Sexual Activity   Alcohol use: Yes    Alcohol/week: 1.0 standard drink of alcohol    Types: 1 Glasses of wine per week    Comment: occaisional    Drug use: Never   Sexual activity: Yes    Birth control/protection: Post-menopausal  Other Topics Concern   Not on file  Social History Narrative   Lives alone but pt states she has a current boyfriend and close friends and neighbors.   Social Determinants of Health   Financial Resource Strain: Low Risk  (01/25/2023)   Overall Financial Resource Strain (CARDIA)    Difficulty of Paying Living Expenses: Not hard at all  Food Insecurity: No Food Insecurity (01/25/2023)   Hunger Vital Sign    Worried About Running Out of Food in the Last Year: Never true    Ran Out of Food in the Last Year: Never true  Transportation Needs: No Transportation Needs (01/25/2023)   PRAPARE - Administrator, Civil Service (Medical): No    Lack of Transportation (Non-Medical): No  Physical Activity: Unknown (01/25/2023)   Exercise Vital Sign    Days of Exercise per Week: 0 days    Minutes of Exercise per Session: Not on file  Stress: No Stress Concern Present (01/25/2023)   [employer] of Occupational Health - Occupational Stress Questionnaire    Feeling of Stress : Not at all  Social Connections: Socially Isolated (01/25/2023)   Social Connection and Isolation Panel [NHANES]     Frequency of Communication with Friends and Family: More than three times a week    Frequency of Social Gatherings with Friends and Family: Once a week    Attends Religious Services: Never    Database administrator or Organizations: No    Attends Banker Meetings: Not on file    Marital Status: Divorced  Intimate Partner Violence: Unknown (06/26/2021)   [facility]   HITS    Physically Hurt: Not on file    Insult or Talk Down To: Not on file    Threaten Physical Harm: Not on file    Scream or Curse: Not on file    Review of Systems   General: Negative for anorexia, weight loss, fever, chills, fatigue, weakness. Eyes: Negative for vision changes.  ENT: Negative for hoarseness,  nasal congestion. See hpi CV: Negative for chest pain, angina, palpitations, dyspnea on exertion, peripheral edema.  Respiratory: Negative for dyspnea at rest, dyspnea on exertion, cough, sputum, wheezing.  GI: See history of present illness. GU:  Negative for dysuria, hematuria, urinary incontinence, urinary frequency, nocturnal urination.  MS: Negative for joint pain, low back pain.  Derm: Negative for rash or itching.  Neuro: Negative for weakness, abnormal sensation, seizure, frequent headaches, +memory loss,  confusion.  Psych: Negative for anxiety, depression, suicidal ideation, hallucinations.  Endo: Negative for unusual weight change.  Heme: Negative for bruising or bleeding. Allergy: Negative for rash or hives.  Physical Exam   BP 102/62   Pulse 62   Temp 98.6 F (37 C)   Ht 5' (1.524 m)   Wt 81 lb 3.2 oz (36.8 kg)   LMP  (LMP Unknown)   BMI 15.86 kg/m    General: Well-nourished, well-developed in no acute distress.  Head: Normocephalic, atraumatic.   Eyes: Conjunctiva pink, no icterus. Mouth: Oropharyngeal mucosa moist and pink   Neck: Supple without thyromegaly, masses, or lymphadenopathy.  Lungs: Clear to auscultation bilaterally.  Heart:  Regular rate and rhythm, no murmurs rubs or gallops.  Abdomen: Bowel sounds are normal, nontender, nondistended, no hepatosplenomegaly or masses,  no abdominal bruits or hernia, no rebound or guarding.   Rectal: not performed Extremities: No lower extremity edema. No clubbing or deformities.  Neuro: Alert and oriented x 4 , grossly normal neurologically.  Skin: Warm and dry, no rash or jaundice.   Psych: Alert and cooperative, normal mood and affect.  Labs   Lab Results  Component Value Date   NA 140 07/28/2022   CL 102 07/28/2022   K 4.8 07/28/2022   CO2 25 07/28/2022   BUN 23 07/28/2022   CREATININE 0.86 07/28/2022   EGFR 70 07/28/2022   CALCIUM 10.1 07/28/2022   ALBUMIN 4.1 07/28/2022   GLUCOSE 87 07/28/2022   Lab Results  Component Value Date   ALT 19 07/28/2022   AST 24 07/28/2022   ALKPHOS 64 07/28/2022   BILITOT 0.4 07/28/2022   Lab Results  Component Value Date   WBC 4.7 07/28/2022   HGB 12.0 07/28/2022   HCT 36.9 07/28/2022   MCV 95 07/28/2022   PLT 272 07/28/2022   Lab Results  Component Value Date   TSH 1.030 07/28/2022   Lab Results  Component Value Date   VITAMINB12 1,423 (H) 07/28/2022   Lab Results  Component Value Date   FOLATE >20.0 07/28/2022    Imaging Studies   No results found.  Assessment/Plan:   Esophageal dysphagia/chronic GERD -patient reports history of hiatal hernia, she cannot remember ever having an EGD -over the past year she has had progressive solid food dysphagia. Significant other reports she is unable to eat very much before she has to stop due to pain in the lower chest area.  -ddx includes esophageal stricture, hiatal hernia, esophageal dysmotility, malignancy -recommend EGD/ED with Dr. Marletta Lor. ASA 2.  I have discussed the risks, alternatives, benefits with regards to but not limited  to the risk of reaction to medication, bleeding, infection, perforation and the patient is agreeable to proceed. Written consent to be  obtained. -continue PPI BID.     Leanna Battles. Melvyn Neth, MHS, PA-C [facility]

## 2023-01-30 NOTE — H&P (View-Only) (Signed)
 GI Office Note    Referring Provider: Billie Lade, MD Primary Care Physician:  Billie Lade, MD  Primary Gastroenterologist: Hennie Duos. Marletta Lor, DO   Chief Complaint   Chief Complaint  Patient presents with   Dysphagia    Pt states whenever she eats it feels like it is stuck in the middle of her chest. Mostly meats     History of Present Illness   Michelle Jackson is a 77 y.o. female presenting today for further evaluation of solid food dysphagia.  She has history of mild cognitive impairment. Presents with her significant other of three years. She reports chronic GERD and has been on pantoprazole BID for some time. She is having issues with sensation of food sticking in the lower chest area. Tries to wash down food. Has had vomiting with it. Symptoms mostly with meats but also with cooked vegetables. Unable to eat much before she has to stop. Used to weigh 120 pounds at her heaviest but in the past 2 years her heaviest weight was 91 pounds. BMs range from constipation to diarrhea. Takes Pepto as needed. Denies melena, brbpr. Typical heartburn well controlled.   She states she has history of hiatal hernia, she denies prior EGD.   Colonoscopy August 2023: -Diverticulosis in the sigmoid colon and descending colon -two 1 to 2 mm polyps in the transverse colon removed, tubular adenomas -Surveillance colonoscopy in 5 years  Wt Readings from Last 10 Encounters:  01/30/23 81 lb 3.2 oz (36.8 kg)  01/28/23 80 lb 9.6 oz (36.6 kg)  10/29/22 81 lb 4 oz (36.9 kg)  10/01/22 78 lb (35.4 kg)  08/27/22 80 lb (36.3 kg)  08/22/22 80 lb 3.2 oz (36.4 kg)  07/28/22 82 lb 12.8 oz (37.6 kg)  03/28/22 81 lb 6.4 oz (36.9 kg)  02/28/22 82 lb 12.8 oz (37.6 kg)  01/31/22 83 lb 6.4 oz (37.8 kg)     Medications   Current Outpatient Medications  Medication Sig Dispense Refill   amLODipine (NORVASC) 10 MG tablet TAKE 1 TABLET BY MOUTH EVERY DAY 90 tablet 1   Apoaequorin (PREVAGEN PO)  Take 1 tablet by mouth daily.     Calcium Carbonate Antacid (CALCIUM CARBONATE PO) Take 1,200 mg by mouth daily.     Calcium Carbonate-Vit D-Min (CALCIUM 1200 PO) Take 600 mg by mouth 2 (two) times daily. Magnesium  Zinc Copper Manganese sulfate     Cholecalciferol (VITAMIN D) 50 MCG (2000 UT) CAPS Take 2,000 Units by mouth daily.     Ginkgo Biloba 120 MG TABS Take 120 mg by mouth daily.     Glycerin-Hypromellose-PEG 400 (DRY EYE RELIEF DROPS OP) Place 1 drop into both eyes daily as needed (Dry eyes).     Lysine 1000 MG TABS Take 1,000 mg by mouth daily.     Magnesium 200 MG TABS Take 400 mg by mouth daily. Gummy     Melatonin 10 MG TABS Take 20 mg by mouth at bedtime. Gummy     Multiple Vitamin (MULTIVITAMIN) tablet Take 1 tablet by mouth daily. Woman's 50+ With Iron     Omega-3 Fatty Acids (FISH OIL) 1200 MG CAPS Take 1,200 mg by mouth 2 (two) times daily. 2 daily     pantoprazole (PROTONIX) 40 MG tablet TAKE 1 TABLET BY MOUTH TWICE A DAY 180 tablet 0   valsartan (DIOVAN) 320 MG tablet TAKE 1 TABLET BY MOUTH EVERY DAY 90 tablet 2   No current facility-administered medications for  this visit.    Allergies   Allergies as of 01/30/2023 - Review Complete 01/30/2023  Allergen Reaction Noted   Iodinated contrast media Hives 10/25/2010   Other Hives 12/29/2011   Tape Other (See Comments) 01/22/2011    Past Medical History   Past Medical History:  Diagnosis Date   GERD (gastroesophageal reflux disease)    Hypertension    Neuromuscular disorder (HCC)    femoral nerve severed during hernia surgery; wears right knee brace    Past Surgical History   Past Surgical History:  Procedure Laterality Date   COLONOSCOPY WITH PROPOFOL N/A 11/05/2021   Procedure: COLONOSCOPY WITH PROPOFOL;  Surgeon: Lanelle Bal, DO;  Location: [facility];  Service: Endoscopy;  Laterality: N/A;  11:00 ASA 2   HERNIA REPAIR     POLYPECTOMY  11/05/2021   Procedure: POLYPECTOMY;  Surgeon: Lanelle Bal, DO;  Location: [facility];  Service: Endoscopy;;    Past Family History   Family History  Problem Relation Age of Onset   Colon cancer Neg Hx    Breast cancer Neg Hx     Past Social History   Social History   Socioeconomic History   Marital status: Divorced    Spouse name: Not on file   Number of children: 0   Years of education: Not on file   Highest education level: 12th grade  Occupational History   Not on file  Tobacco Use   Smoking status: Never   Smokeless tobacco: Never  Vaping Use   Vaping status: Never Used  Substance and Sexual Activity   Alcohol use: Yes    Alcohol/week: 1.0 standard drink of alcohol    Types: 1 Glasses of wine per week    Comment: occaisional    Drug use: Never   Sexual activity: Yes    Birth control/protection: Post-menopausal  Other Topics Concern   Not on file  Social History Narrative   Lives alone but pt states she has a current boyfriend and close friends and neighbors.   Social Determinants of Health   Financial Resource Strain: Low Risk  (01/25/2023)   Overall Financial Resource Strain (CARDIA)    Difficulty of Paying Living Expenses: Not hard at all  Food Insecurity: No Food Insecurity (01/25/2023)   Hunger Vital Sign    Worried About Running Out of Food in the Last Year: Never true    Ran Out of Food in the Last Year: Never true  Transportation Needs: No Transportation Needs (01/25/2023)   PRAPARE - Administrator, Civil Service (Medical): No    Lack of Transportation (Non-Medical): No  Physical Activity: Unknown (01/25/2023)   Exercise Vital Sign    Days of Exercise per Week: 0 days    Minutes of Exercise per Session: Not on file  Stress: No Stress Concern Present (01/25/2023)   [employer] of Occupational Health - Occupational Stress Questionnaire    Feeling of Stress : Not at all  Social Connections: Socially Isolated (01/25/2023)   Social Connection and Isolation Panel [NHANES]     Frequency of Communication with Friends and Family: More than three times a week    Frequency of Social Gatherings with Friends and Family: Once a week    Attends Religious Services: Never    Database administrator or Organizations: No    Attends Banker Meetings: Not on file    Marital Status: Divorced  Intimate Partner Violence: Unknown (06/26/2021)   [facility]   HITS    Physically Hurt: Not on file    Insult or Talk Down To: Not on file    Threaten Physical Harm: Not on file    Scream or Curse: Not on file    Review of Systems   General: Negative for anorexia, weight loss, fever, chills, fatigue, weakness. Eyes: Negative for vision changes.  ENT: Negative for hoarseness,  nasal congestion. See hpi CV: Negative for chest pain, angina, palpitations, dyspnea on exertion, peripheral edema.  Respiratory: Negative for dyspnea at rest, dyspnea on exertion, cough, sputum, wheezing.  GI: See history of present illness. GU:  Negative for dysuria, hematuria, urinary incontinence, urinary frequency, nocturnal urination.  MS: Negative for joint pain, low back pain.  Derm: Negative for rash or itching.  Neuro: Negative for weakness, abnormal sensation, seizure, frequent headaches, +memory loss,  confusion.  Psych: Negative for anxiety, depression, suicidal ideation, hallucinations.  Endo: Negative for unusual weight change.  Heme: Negative for bruising or bleeding. Allergy: Negative for rash or hives.  Physical Exam   BP 102/62   Pulse 62   Temp 98.6 F (37 C)   Ht 5' (1.524 m)   Wt 81 lb 3.2 oz (36.8 kg)   LMP  (LMP Unknown)   BMI 15.86 kg/m    General: Well-nourished, well-developed in no acute distress.  Head: Normocephalic, atraumatic.   Eyes: Conjunctiva pink, no icterus. Mouth: Oropharyngeal mucosa moist and pink   Neck: Supple without thyromegaly, masses, or lymphadenopathy.  Lungs: Clear to auscultation bilaterally.  Heart:  Regular rate and rhythm, no murmurs rubs or gallops.  Abdomen: Bowel sounds are normal, nontender, nondistended, no hepatosplenomegaly or masses,  no abdominal bruits or hernia, no rebound or guarding.   Rectal: not performed Extremities: No lower extremity edema. No clubbing or deformities.  Neuro: Alert and oriented x 4 , grossly normal neurologically.  Skin: Warm and dry, no rash or jaundice.   Psych: Alert and cooperative, normal mood and affect.  Labs   Lab Results  Component Value Date   NA 140 07/28/2022   CL 102 07/28/2022   K 4.8 07/28/2022   CO2 25 07/28/2022   BUN 23 07/28/2022   CREATININE 0.86 07/28/2022   EGFR 70 07/28/2022   CALCIUM 10.1 07/28/2022   ALBUMIN 4.1 07/28/2022   GLUCOSE 87 07/28/2022   Lab Results  Component Value Date   ALT 19 07/28/2022   AST 24 07/28/2022   ALKPHOS 64 07/28/2022   BILITOT 0.4 07/28/2022   Lab Results  Component Value Date   WBC 4.7 07/28/2022   HGB 12.0 07/28/2022   HCT 36.9 07/28/2022   MCV 95 07/28/2022   PLT 272 07/28/2022   Lab Results  Component Value Date   TSH 1.030 07/28/2022   Lab Results  Component Value Date   VITAMINB12 1,423 (H) 07/28/2022   Lab Results  Component Value Date   FOLATE >20.0 07/28/2022    Imaging Studies   No results found.  Assessment/Plan:   Esophageal dysphagia/chronic GERD -patient reports history of hiatal hernia, she cannot remember ever having an EGD -over the past year she has had progressive solid food dysphagia. Significant other reports she is unable to eat very much before she has to stop due to pain in the lower chest area.  -ddx includes esophageal stricture, hiatal hernia, esophageal dysmotility, malignancy -recommend EGD/ED with Dr. Marletta Lor. ASA 2.  I have discussed the risks, alternatives, benefits with regards to but not limited  to the risk of reaction to medication, bleeding, infection, perforation and the patient is agreeable to proceed. Written consent to be  obtained. -continue PPI BID.     Leanna Battles. Melvyn Neth, MHS, PA-C [facility]

## 2023-01-30 NOTE — Patient Instructions (Signed)
Continue pantoprazole 40 mg twice daily before breakfast and evening meal. Upper endoscopy to be scheduled to evaluate your esophagus.

## 2023-02-02 ENCOUNTER — Telehealth: Payer: Self-pay | Admitting: *Deleted

## 2023-02-02 NOTE — Telephone Encounter (Signed)
Pt was in office and originally scheduled for 03/10/23. She asked to be called if there was a sooner appt. I called pt and informed her that had cancelling on 02/24/23. She took that appointment and updated instructions mailed to pt.

## 2023-02-23 ENCOUNTER — Ambulatory Visit (INDEPENDENT_AMBULATORY_CARE_PROVIDER_SITE_OTHER): Payer: PPO

## 2023-02-23 VITALS — Ht 60.0 in | Wt 77.0 lb

## 2023-02-23 DIAGNOSIS — Z1231 Encounter for screening mammogram for malignant neoplasm of breast: Secondary | ICD-10-CM

## 2023-02-23 DIAGNOSIS — Z0000 Encounter for general adult medical examination without abnormal findings: Secondary | ICD-10-CM | POA: Diagnosis not present

## 2023-02-23 DIAGNOSIS — Z780 Asymptomatic menopausal state: Secondary | ICD-10-CM

## 2023-02-23 NOTE — Patient Instructions (Signed)
Michelle Jackson , Thank you for taking time to come for your Medicare Wellness Visit. I appreciate your ongoing commitment to your health goals. Please review the following plan we discussed and let me know if I can assist you in the future.   Referrals/Orders/Follow-Ups/Clinician Recommendations:  You have an order for:  []   2D Mammogram  [x]   3D Mammogram  [x]   Bone Density   []   Lung Cancer Screening  Please call for appointment:   [facility] [address] [place], [place] [address] [phone]  Make sure to wear two-piece clothing.  No lotions powders or deodorants the day of the appointment Make sure to bring picture ID and insurance card.  Bring list of medications you are currently taking including any supplements.   Next Medicare Annual Wellness Visit: February 24, 2024 at 3:10 pm virtual visit This is a list of the screening recommended for you and due dates:  Health Maintenance  Topic Date Due   Mammogram  09/21/2022   COVID-19 Vaccine (8 - 2023-24 season) 03/30/2023   Medicare Annual Wellness Visit  02/23/2024   DTaP/Tdap/Td vaccine (3 - Td or Tdap) 11/15/2025   Colon Cancer Screening  11/06/2026   Pneumonia Vaccine  Completed   Flu Shot  Completed   DEXA scan (bone density measurement)  Completed   Hepatitis C Screening  Completed   Zoster (Shingles) Vaccine  Completed   HPV Vaccine  Aged Out    Advanced directives: (Declined) Advance directive discussed with you today. Even though you declined this today, please call our office should you change your mind, and we can give you the proper paperwork for you to fill out.  Next Medicare Annual Wellness Visit scheduled for next year: Yes  Preventive Care 45 Years and Older, Female Preventive care refers to lifestyle choices and visits with your health care provider that can promote health and wellness. Preventive care visits are also called wellness exams. What can I expect for my  preventive care visit? Counseling Your health care provider may ask you questions about your: Medical history, including: Past medical problems. Family medical history. Pregnancy and menstrual history. History of falls. Current health, including: Memory and ability to understand (cognition). Emotional well-being. Home life and relationship well-being. Sexual activity and sexual health. Lifestyle, including: Alcohol, nicotine or tobacco, and drug use. Access to firearms. Diet, exercise, and sleep habits. Work and work Astronomer. Sunscreen use. Safety issues such as seatbelt and bike helmet use. Physical exam Your health care provider will check your: Height and weight. These may be used to calculate your BMI (body mass index). BMI is a measurement that tells if you are at a healthy weight. Waist circumference. This measures the distance around your waistline. This measurement also tells if you are at a healthy weight and may help predict your risk of certain diseases, such as type 2 diabetes and high blood pressure. Heart rate and blood pressure. Body temperature. Skin for abnormal spots. What immunizations do I need?  Vaccines are usually given at various ages, according to a schedule. Your health care provider will recommend vaccines for you based on your age, medical history, and lifestyle or other factors, such as travel or where you work. What tests do I need? Screening Your health care provider may recommend screening tests for certain conditions. This may include: Lipid and cholesterol levels. Hepatitis C test. Hepatitis B test. HIV (human immunodeficiency virus) test. STI (sexually transmitted infection) testing, if you  are at risk. Lung cancer screening. Colorectal cancer screening. Diabetes screening. This is done by checking your blood sugar (glucose) after you have not eaten for a while (fasting). Mammogram. Talk with your health care provider about how often you  should have regular mammograms. BRCA-related cancer screening. This may be done if you have a family history of breast, ovarian, tubal, or peritoneal cancers. Bone density scan. This is done to screen for osteoporosis. Talk with your health care provider about your test results, treatment options, and if necessary, the need for more tests. Follow these instructions at home: Eating and drinking  Eat a diet that includes fresh fruits and vegetables, whole grains, lean protein, and low-fat dairy products. Limit your intake of foods with high amounts of sugar, saturated fats, and salt. Take vitamin and mineral supplements as recommended by your health care provider. Do not drink alcohol if your health care provider tells you not to drink. If you drink alcohol: Limit how much you have to 0-1 drink a day. Know how much alcohol is in your drink. In the [place] one drink equals one 12 oz bottle of beer (355 mL), one 5 oz glass of wine (148 mL), or one 1 oz glass of hard liquor (44 mL). Lifestyle Brush your teeth every morning and night with fluoride toothpaste. Floss one time each day. Exercise for at least 30 minutes 5 or more days each week. Do not use any products that contain nicotine or tobacco. These products include cigarettes, chewing tobacco, and vaping devices, such as e-cigarettes. If you need help quitting, ask your health care provider. Do not use drugs. If you are sexually active, practice safe sex. Use a condom or other form of protection in order to prevent STIs. Take aspirin only as told by your health care provider. Make sure that you understand how much to take and what form to take. Work with your health care provider to find out whether it is safe and beneficial for you to take aspirin daily. Ask your health care provider if you need to take a cholesterol-lowering medicine (statin). Find healthy ways to manage stress, such as: Meditation, yoga, or listening to  music. Journaling. Talking to a trusted person. Spending time with friends and family. Minimize exposure to UV radiation to reduce your risk of skin cancer. Safety Always wear your seat belt while driving or riding in a vehicle. Do not drive: If you have been drinking alcohol. Do not ride with someone who has been drinking. When you are tired or distracted. While texting. If you have been using any mind-altering substances or drugs. Wear a helmet and other protective equipment during sports activities. If you have firearms in your house, make sure you follow all gun safety procedures. What's next? Visit your health care provider once a year for an annual wellness visit. Ask your health care provider how often you should have your eyes and teeth checked. Stay up to date on all vaccines. This information is not intended to replace advice given to you by your health care provider. Make sure you discuss any questions you have with your health care provider. Document Revised: 09/05/2020 Document Reviewed: 09/05/2020 Elsevier Patient Education  2024 [employer]. Understanding Your Risk for Falls Millions of people have serious injuries from falls each year. It is important to understand your risk of falling. Talk with your health care provider about your risk and what you can do to lower it. If you do have a serious fall, make  sure to tell your provider. Falling once raises your risk of falling again. How can falls affect me? Serious injuries from falls are common. These include: Broken bones, such as hip fractures. Head injuries, such as traumatic brain injuries (TBI) or concussions. A fear of falling can cause you to avoid activities and stay at home. This can make your muscles weaker and raise your risk for a fall. What can increase my risk? There are a number of risk factors that increase your risk for falling. The more risk factors you have, the higher your risk of falling. Serious  injuries from a fall happen most often to people who are older than 76 years old. Teenagers and young adults ages 9-29 are also at higher risk. Common risk factors include: Weakness in the lower body. Being generally weak or confused due to long-term (chronic) illness. Dizziness or balance problems. Poor vision. Medicines that cause dizziness or drowsiness. These may include: Medicines for your blood pressure, heart, anxiety, insomnia, or swelling (edema). Pain medicines. Muscle relaxants. Other risk factors include: Drinking alcohol. Having had a fall in the past. Having foot pain or wearing improper footwear. Working at a dangerous job. Having any of the following in your home: Tripping hazards, such as floor clutter or loose rugs. Poor lighting. Pets. Having dementia or memory loss. What actions can I take to lower my risk of falling?     Physical activity Stay physically fit. Do strength and balance exercises. Consider taking a regular class to build strength and balance. Yoga and tai chi are good options. Vision Have your eyes checked every year and your prescription for glasses or contacts updated as needed. Shoes and walking aids Wear non-skid shoes. Wear shoes that have rubber soles and low heels. Do not wear high heels. Do not walk around the house in socks or slippers. Use a cane or walker as told by your provider. Home safety Attach secure railings on both sides of your stairs. Install grab bars for your bathtub, shower, and toilet. Use a non-skid mat in your bathtub or shower. Attach bath mats securely with double-sided, non-slip rug tape. Use good lighting in all rooms. Keep a flashlight near your bed. Make sure there is a clear path from your bed to the bathroom. Use night-lights. Do not use throw rugs. Make sure all carpeting is taped or tacked down securely. Remove all clutter from walkways and stairways, including extension cords. Repair uneven or broken  steps and floors. Avoid walking on icy or slippery surfaces. Walk on the grass instead of on icy or slick sidewalks. Use ice melter to get rid of ice on walkways in the winter. Use a cordless phone. Questions to ask your health care provider Can you help me check my risk for a fall? Do any of my medicines make me more likely to fall? Should I take a vitamin D supplement? What exercises can I do to improve my strength and balance? Should I make an appointment to have my vision checked? Do I need a bone density test to check for weak bones (osteoporosis)? Would it help to use a cane or a walker? Where to find more information Centers for Disease Control and Prevention, [employer]: TonerPromos.no Community-Based Fall Prevention Programs: TonerPromos.no [employer] on Aging: BaseRingTones.pl Contact a health care provider if: You fall at home. You are afraid of falling at home. You feel weak, drowsy, or dizzy. This information is not intended to replace advice given to you by your health care  provider. Make sure you discuss any questions you have with your health care provider. Document Revised: 11/11/2021 Document Reviewed: 11/11/2021 Elsevier Patient Education  2024 Elsevier Inc. Bone Density Test A bone density test uses a type of X-ray to measure the amount of calcium and other minerals in a person's bones. It can measure bone density in the hip and the spine. The test is similar to having a regular X-ray. This test may also be called: Bone densitometry. Bone mineral density test. Dual-energy X-ray absorptiometry (DEXA). You may have this test to: Diagnose a condition that causes weak or thin bones (osteoporosis). Screen you for osteoporosis. Predict your risk for a broken bone (fracture). Determine how well your osteoporosis treatment is working. Tell a health care provider about: Any allergies you have. All medicines you are taking, including vitamins, herbs, eye drops, creams, and  over-the-counter medicines. Any problems you or family members have had with anesthetic medicines. Any blood disorders you have. Any surgeries you have had. Any medical conditions you have. Whether you are pregnant or may be pregnant. Any medical tests you have had within the past 14 days that used contrast material. What are the risks? Generally, this is a safe test. However, it does expose you to a small amount of radiation, which can slightly increase your cancer risk. What happens before the test? Do not take any calcium supplements within the 24 hours before your test. You will need to remove all metal jewelry, eyeglasses, removable dental appliances, and any other metal objects on your body. What happens during the test?  You will lie down on an exam table. There will be an X-ray generator below you and an imaging device above you. Other devices, such as boxes or braces, may be used to position your body properly for the scan. The machine will slowly scan your body. You will need to keep very still while the machine does the scan. The images will show up on a screen in the room. Images will be examined by a specialist after your test is finished. The procedure may vary among health care providers and hospitals. What can I expect after the test? It is up to you to get the results of your test. Ask your health care provider, or the department that is doing the test, when your results will be ready. Summary A bone density test is an imaging test that uses a type of X-ray to measure the amount of calcium and other minerals in your bones. The test may be used to diagnose or screen you for a condition that causes weak or thin bones (osteoporosis), predict your risk for a broken bone (fracture), or determine how well your osteoporosis treatment is working. Do not take any calcium supplements within 24 hours before your test. Ask your health care provider, or the department that is doing the  test, when your results will be ready. This information is not intended to replace advice given to you by your health care provider. Make sure you discuss any questions you have with your health care provider. Document Revised: 11/21/2020 Document Reviewed: 08/25/2019 Elsevier Patient Education  2024 [employer].

## 2023-02-23 NOTE — Progress Notes (Signed)
 Because this visit was a virtual/telehealth visit,  certain criteria was not obtained, such a blood pressure, CBG if applicable, and timed get up and go. Any medications not marked as "taking" were not mentioned during the medication reconciliation part of the visit. Any vitals not documented were not able to be obtained due to this being a telehealth visit or patient was unable to self-report a recent blood pressure reading due to a lack of equipment at home via telehealth. Vitals that have been documented are verbally provided by the patient.   Subjective:   Michelle Jackson is a 77 y.o. female who presents for Medicare Annual (Subsequent) preventive examination.  Visit Complete: Virtual I connected with  Michelle Jackson on 02/23/23 by a audio enabled telemedicine application and verified that I am speaking with the correct person using two identifiers.  Patient Location: Home  Provider Location: Home Office  I discussed the limitations of evaluation and management by telemedicine. The patient expressed understanding and agreed to proceed.  Vital Signs: Because this visit was a virtual/telehealth visit, some criteria may be missing or patient reported. Any vitals not documented were not able to be obtained and vitals that have been documented are patient reported.  Patient Medicare AWV questionnaire was completed by the patient on 02/19/2023; I have confirmed that all information answered by patient is correct and no changes since this date.  Cardiac Risk Factors include: advanced age (>46men, >27 women);dyslipidemia;hypertension;sedentary lifestyle     Objective:    Today's Vitals   02/23/23 1044  Weight: 77 lb (34.9 kg)  Height: 5' (1.524 m)   Body mass index is 15.04 kg/m.     02/23/2023   10:37 AM 01/21/2022    1:13 PM 11/05/2021    9:29 AM 01/09/2021    3:03 PM  Advanced Directives  Does Patient Have a Medical Advance Directive? No No No No  Would patient like information on  creating a medical advance directive? No - Patient declined Yes (MAU/Ambulatory/Procedural Areas - Information given) No - Patient declined No - Patient declined    Current Medications (verified) Outpatient Encounter Medications as of 02/23/2023  Medication Sig   amLODipine (NORVASC) 10 MG tablet TAKE 1 TABLET BY MOUTH EVERY DAY   Apoaequorin (PREVAGEN PO) Take 1 tablet by mouth daily.   Calcium Carbonate Antacid (CALCIUM CARBONATE PO) Take 1,200 mg by mouth daily.   Calcium Carbonate-Vit D-Min (CALCIUM 1200 PO) Take 600 mg by mouth 2 (two) times daily. Magnesium  Zinc Copper Manganese sulfate   Cholecalciferol (VITAMIN D) 50 MCG (2000 UT) CAPS Take 2,000 Units by mouth daily.   Ginkgo Biloba 120 MG TABS Take 120 mg by mouth daily.   Glycerin-Hypromellose-PEG 400 (DRY EYE RELIEF DROPS OP) Place 1 drop into both eyes daily as needed (Dry eyes).   Lysine 1000 MG TABS Take 1,000 mg by mouth daily.   Magnesium 200 MG TABS Take 400 mg by mouth daily. Gummy   Melatonin 10 MG TABS Take 20 mg by mouth at bedtime. Gummy   Multiple Vitamin (MULTIVITAMIN) tablet Take 1 tablet by mouth daily. Woman's 50+ With Iron   Omega-3 Fatty Acids (FISH OIL) 1200 MG CAPS Take 1,200 mg by mouth 2 (two) times daily. 2 daily   pantoprazole (PROTONIX) 40 MG tablet TAKE 1 TABLET BY MOUTH TWICE A DAY   valsartan (DIOVAN) 320 MG tablet TAKE 1 TABLET BY MOUTH EVERY DAY   No facility-administered encounter medications on file as of 02/23/2023.  Allergies (verified) Iodinated contrast media, Other, and Tape   History: Past Medical History:  Diagnosis Date   GERD (gastroesophageal reflux disease)    Hypertension    Neuromuscular disorder (HCC)    femoral nerve severed during hernia surgery; wears right knee brace   Past Surgical History:  Procedure Laterality Date   COLONOSCOPY WITH PROPOFOL N/A 11/05/2021   Procedure: COLONOSCOPY WITH PROPOFOL;  Surgeon: Lanelle Bal, DO;  Location: [facility];   Service: Endoscopy;  Laterality: N/A;  11:00 ASA 2   HERNIA REPAIR     POLYPECTOMY  11/05/2021   Procedure: POLYPECTOMY;  Surgeon: Lanelle Bal, DO;  Location: [facility];  Service: Endoscopy;;   Family History  Problem Relation Age of Onset   Colon cancer Neg Hx    Breast cancer Neg Hx    Social History   Socioeconomic History   Marital status: Divorced    Spouse name: Not on file   Number of children: 0   Years of education: Not on file   Highest education level: 12th grade  Occupational History   Not on file  Tobacco Use   Smoking status: Never   Smokeless tobacco: Never  Vaping Use   Vaping status: Never Used  Substance and Sexual Activity   Alcohol use: Yes    Alcohol/week: 1.0 standard drink of alcohol    Types: 1 Glasses of wine per week    Comment: occaisional    Drug use: Never   Sexual activity: Yes    Birth control/protection: Post-menopausal  Other Topics Concern   Not on file  Social History Narrative   Lives alone but pt states she has a current boyfriend and close friends and neighbors.   Social Determinants of Health   Financial Resource Strain: Low Risk  (02/23/2023)   Overall Financial Resource Strain (CARDIA)    Difficulty of Paying Living Expenses: Not hard at all  Food Insecurity: No Food Insecurity (02/23/2023)   Hunger Vital Sign    Worried About Running Out of Food in the Last Year: Never true    Ran Out of Food in the Last Year: Never true  Transportation Needs: No Transportation Needs (02/23/2023)   PRAPARE - Administrator, Civil Service (Medical): No    Lack of Transportation (Non-Medical): No  Physical Activity: Inactive (02/23/2023)   Exercise Vital Sign    Days of Exercise per Week: 0 days    Minutes of Exercise per Session: 0 min  Stress: No Stress Concern Present (02/23/2023)   [employer] of Occupational Health - Occupational Stress Questionnaire    Feeling of Stress : Not at all  Social Connections:  Moderately Isolated (02/23/2023)   Social Connection and Isolation Panel [NHANES]    Frequency of Communication with Friends and Family: More than three times a week    Frequency of Social Gatherings with Friends and Family: Once a week    Attends Religious Services: Never    Database administrator or Organizations: No    Attends Engineer, structural: Never    Marital Status: Married    Tobacco Counseling Counseling given: No   Clinical Intake:  Pre-visit preparation completed: Yes  Pain : No/denies pain     BMI - recorded: 15.04 Nutritional Status: BMI <19  Underweight Nutritional Risks: None Diabetes: No  How often do you need to have someone help you when you read instructions, pamphlets, or other written materials from your doctor or pharmacy?: 1 -  Never     Information entered by ::  Ebbie Sorenson, CMA   Activities of Daily Living    02/23/2023   10:45 AM  In your present state of health, do you have any difficulty performing the following activities:  Hearing? 0  Vision? 0  Difficulty concentrating or making decisions? 0  Walking or climbing stairs? 0  Dressing or bathing? 0  Doing errands, shopping? 0  Preparing Food and eating ? N  Using the Toilet? N  In the past six months, have you accidently leaked urine? N  Do you have problems with loss of bowel control? N  Managing your Medications? N  Managing your Finances? N  Housekeeping or managing your Housekeeping? N    Patient Care Team: Billie Lade, MD as PCP - General (Internal Medicine) Lanelle Bal, DO as Consulting Physician (Gastroenterology)  Indicate any recent Medical Services you may have received from other than [facility] providers in the past year (date may be approximate).     Assessment:   This is a routine wellness examination for Michelle Jackson.  Hearing/Vision screen Hearing Screening - Comments:: Patient denies any hearing difficulties.   Vision Screening - Comments:: Wears  reading glasses. UTD with yearly exams. Sees Dr. Daisy Lazar at [facility]   Goals Addressed             This Visit's Progress    Patient Stated       Gain weight        Depression Screen    02/23/2023   10:45 AM 01/28/2023    1:32 PM 08/22/2022   10:48 AM 07/28/2022   11:08 AM 03/28/2022   11:38 AM 02/28/2022   11:01 AM 01/31/2022   10:07 AM  PHQ 2/9 Scores  PHQ - 2 Score 0 0 0 0 0 0 0  PHQ- 9 Score   1 0 0 0 0    Fall Risk    02/23/2023   10:47 AM 01/28/2023    1:32 PM 08/22/2022   10:48 AM 07/28/2022   11:08 AM 03/28/2022   11:38 AM  Fall Risk   Falls in the past year? 0 0 0 0 0  Number falls in past yr: 0 0 0 0 0  Injury with Fall? 0 0 0 0 0  Risk for fall due to : No Fall Risks No Fall Risks No Fall Risks No Fall Risks No Fall Risks  Follow up Falls prevention discussed Falls evaluation completed Falls evaluation completed Falls evaluation completed Falls evaluation completed    MEDICARE RISK AT HOME: Medicare Risk at Home Any stairs in or around the home?: No If so, are there any without handrails?: No Home free of loose throw rugs in walkways, pet beds, electrical cords, etc?: Yes Adequate lighting in your home to reduce risk of falls?: Yes Life alert?: No Use of a cane, walker or w/c?: No Grab bars in the bathroom?: No Shower chair or bench in shower?: Yes Elevated toilet seat or a handicapped toilet?: No  TIMED UP AND GO:  Was the test performed?  No    Cognitive Function:    07/18/2020    9:23 AM  MMSE - Mini Mental State Exam  Not completed: --  Orientation to time 4  Orientation to Place 5  Registration 3  Attention/ Calculation 5  Recall 2  Language- name 2 objects 2  Language- repeat 1  Language- follow 3 step command 3  Language- read & follow direction 1  Write a sentence 1  Copy design 1  Total score 28        02/23/2023   10:45 AM 01/21/2022    1:17 PM  6CIT Screen  What Year? 0 points 0 points  What month? 0 points 0 points   What time? 0 points 0 points  Count back from 20 0 points 0 points  Months in reverse 0 points 4 points  Repeat phrase 0 points 0 points  Total Score 0 points 4 points    Immunizations Immunization History  Administered Date(s) Administered   Fluad Quad(high Dose 65+) 11/29/2015, 12/20/2017, 12/02/2020, 12/14/2021   Fluad Trivalent(High Dose 65+) 12/13/2022   Influenza Split 12/29/2011, 11/25/2013   Influenza, High Dose Seasonal PF 12/05/2012, 12/12/2014, 12/01/2016, 12/20/2017, 12/20/2018, 11/23/2019   Influenza,inj,quad, With Preservative 12/05/2012   Influenza-Unspecified 01/08/2011   Moderna Covid-19 Vaccine Bivalent Booster 48yrs & up 12/21/2020   Moderna SARS-COV2 Booster Vaccination 12/20/2021   Moderna Sars-Covid-2 Vaccination 05/14/2019, 06/11/2019, 01/13/2020, 06/24/2020   Pfizer(Comirnaty)Fall Seasonal Vaccine 12 years and older 11/28/2022   Pneumococcal Conjugate-13 12/09/2013   Pneumococcal Polysaccharide-23 04/14/2012, 01/21/2017   Rsv, Bivalent, Protein Subunit Rsvpref,pf Verdis Frederickson) 12/14/2021   Tdap 08/17/2003, 11/16/2015   Zoster Recombinant(Shingrix) 06/02/2017, 02/14/2019, 06/09/2021   Zoster, Live 11/16/2007    TDAP status: Up to date  Flu Vaccine status: Up to date  Pneumococcal vaccine status: Up to date  Covid-19 vaccine status: Completed vaccines  Qualifies for Shingles Vaccine? Yes   Zostavax completed Yes   Shingrix Completed?: Yes  Screening Tests Health Maintenance  Topic Date Due   MAMMOGRAM  09/21/2022   Medicare Annual Wellness (AWV)  01/22/2023   COVID-19 Vaccine (8 - 2023-24 season) 03/30/2023   DTaP/Tdap/Td (3 - Td or Tdap) 11/15/2025   Colonoscopy  11/06/2026   Pneumonia Vaccine 48+ Years old  Completed   INFLUENZA VACCINE  Completed   DEXA SCAN  Completed   Hepatitis C Screening  Completed   Zoster Vaccines- Shingrix  Completed   HPV VACCINES  Aged Out    Health Maintenance  Health Maintenance Due  Topic Date Due    MAMMOGRAM  09/21/2022   Medicare Annual Wellness (AWV)  01/22/2023    Colorectal cancer screening: Type of screening: Colonoscopy. Completed 11/05/2021. Repeat every 5 years  Mammogram status: Ordered 02/23/2023. Pt provided with contact info and advised to call to schedule appt.   Bone Density status: Ordered 02/23/2023. Pt provided with contact info and advised to call to schedule appt.  Lung Cancer Screening: (Low Dose CT Chest recommended if Age 35-80 years, 20 pack-year currently smoking OR have quit w/in 15years.) does not qualify.   Lung Cancer Screening Referral: na  Additional Screening:  Hepatitis C Screening: does not qualify; Completed   Vision Screening: Recommended annual ophthalmology exams for early detection of glaucoma and other disorders of the eye. Is the patient up to date with their annual eye exam?  Yes  Who is the provider or what is the name of the office in which the patient attends annual eye exams? [facility] L[facility] If pt is not established with a provider, would they like to be referred to a provider to establish care? No .   Dental Screening: Recommended annual dental exams for proper oral hygiene  Diabetic Foot Exam: na  Community Resource Referral / Chronic Care Management: CRR required this visit?  No   CCM required this visit?  No     Plan:     I have personally reviewed and noted  the following in the patient's chart:   Medical and social history Use of alcohol, tobacco or illicit drugs  Current medications and supplements including opioid prescriptions. Patient is not currently taking opioid prescriptions. Functional ability and status Nutritional status Physical activity Advanced directives List of other physicians Hospitalizations, surgeries, and ER visits in previous 12 months Vitals Screenings to include cognitive, depression, and falls Referrals and appointments  In addition, I have reviewed and discussed with patient certain  preventive protocols, quality metrics, and best practice recommendations. A written personalized care plan for preventive services as well as general preventive health recommendations were provided to patient.     Jordan Hawks Terrence Pizana, CMA   02/23/2023   After Visit Summary: (Mail) Due to this being a telephonic visit, the after visit summary with patients personalized plan was offered to patient via mail   Nurse Notes: see routing comment

## 2023-02-24 ENCOUNTER — Ambulatory Visit (HOSPITAL_COMMUNITY): Payer: PPO | Admitting: Certified Registered"

## 2023-02-24 ENCOUNTER — Ambulatory Visit (HOSPITAL_COMMUNITY)
Admission: RE | Admit: 2023-02-24 | Discharge: 2023-02-24 | Disposition: A | Discharge status: Home / Self Care | Payer: PPO | Attending: Internal Medicine | Admitting: Internal Medicine

## 2023-02-24 ENCOUNTER — Other Ambulatory Visit: Payer: Self-pay

## 2023-02-24 ENCOUNTER — Encounter (HOSPITAL_COMMUNITY)
Admission: RE | Disposition: A | Discharge status: Home / Self Care | Payer: Self-pay | Source: Home / Self Care | Attending: Internal Medicine

## 2023-02-24 ENCOUNTER — Encounter (HOSPITAL_COMMUNITY): Payer: Self-pay

## 2023-02-24 ENCOUNTER — Telehealth: Payer: Self-pay | Admitting: Internal Medicine

## 2023-02-24 DIAGNOSIS — K219 Gastro-esophageal reflux disease without esophagitis: Secondary | ICD-10-CM | POA: Insufficient documentation

## 2023-02-24 DIAGNOSIS — T182XXA Foreign body in stomach, initial encounter: Secondary | ICD-10-CM | POA: Diagnosis not present

## 2023-02-24 DIAGNOSIS — K2970 Gastritis, unspecified, without bleeding: Secondary | ICD-10-CM

## 2023-02-24 DIAGNOSIS — I10 Essential (primary) hypertension: Secondary | ICD-10-CM | POA: Insufficient documentation

## 2023-02-24 DIAGNOSIS — K3189 Other diseases of stomach and duodenum: Secondary | ICD-10-CM | POA: Diagnosis not present

## 2023-02-24 DIAGNOSIS — F32A Depression, unspecified: Secondary | ICD-10-CM | POA: Diagnosis not present

## 2023-02-24 DIAGNOSIS — K449 Diaphragmatic hernia without obstruction or gangrene: Secondary | ICD-10-CM

## 2023-02-24 DIAGNOSIS — Z79899 Other long term (current) drug therapy: Secondary | ICD-10-CM | POA: Diagnosis not present

## 2023-02-24 DIAGNOSIS — R1310 Dysphagia, unspecified: Secondary | ICD-10-CM | POA: Insufficient documentation

## 2023-02-24 DIAGNOSIS — R634 Abnormal weight loss: Secondary | ICD-10-CM

## 2023-02-24 DIAGNOSIS — R109 Unspecified abdominal pain: Secondary | ICD-10-CM

## 2023-02-24 DIAGNOSIS — R001 Bradycardia, unspecified: Secondary | ICD-10-CM | POA: Diagnosis not present

## 2023-02-24 HISTORY — PX: ESOPHAGOGASTRODUODENOSCOPY (EGD) WITH PROPOFOL: SHX5813

## 2023-02-24 HISTORY — PX: BIOPSY: SHX5522

## 2023-02-24 SURGERY — ESOPHAGOGASTRODUODENOSCOPY (EGD) WITH PROPOFOL
Anesthesia: General

## 2023-02-24 MED ORDER — LACTATED RINGERS IV SOLN: INTRAVENOUS | Status: DC | PRN

## 2023-02-24 MED ORDER — LIDOCAINE HCL (CARDIAC) PF 100 MG/5ML IV SOSY
PREFILLED_SYRINGE | INTRAVENOUS | Status: DC | PRN
  Administered 2023-02-24: 60 mg via INTRAVENOUS

## 2023-02-24 MED ORDER — SUCRALFATE 1 GM/10ML PO SUSP: 1.0000 g | Freq: Four times a day (QID) | ORAL | 11 refills | Status: DC

## 2023-02-24 MED ORDER — PROPOFOL 1000 MG/100ML IV EMUL
INTRAVENOUS | Status: AC
  Filled 2023-02-24: qty 100

## 2023-02-24 MED ORDER — LIDOCAINE HCL (PF) 2 % IJ SOLN
INTRAMUSCULAR | Status: AC
  Filled 2023-02-24: qty 5

## 2023-02-24 MED ORDER — PROPOFOL 10 MG/ML IV BOLUS
INTRAVENOUS | Status: DC | PRN
  Administered 2023-02-24: 80 mg via INTRAVENOUS
  Administered 2023-02-24: 40 mg via INTRAVENOUS

## 2023-02-24 MED ORDER — PROPOFOL 500 MG/50ML IV EMUL
INTRAVENOUS | Status: DC | PRN
  Administered 2023-02-24: 100 ug/kg/min via INTRAVENOUS

## 2023-02-24 NOTE — Telephone Encounter (Signed)
LMOVM to call back to give appt for 12/9, arrival 9:45am, npo midnight prior

## 2023-02-24 NOTE — Telephone Encounter (Signed)
Can we order upper GI series for this patient?  Thank you

## 2023-02-24 NOTE — Anesthesia Preprocedure Evaluation (Signed)
Anesthesia Evaluation  Patient identified by MRN, date of birth, ID band Patient awake    Reviewed: Allergy & Precautions, NPO status , Patient's Chart, lab work & pertinent test results  Airway Mallampati: II  TM Distance: >3 FB Neck ROM: Full    Dental  (+) Dental Advisory Given, Caps   Pulmonary neg pulmonary ROS   Pulmonary exam normal breath sounds clear to auscultation       Cardiovascular Exercise Tolerance: Good hypertension, Pt. on medications Normal cardiovascular exam Rhythm:Regular Rate:Bradycardia     Neuro/Psych  PSYCHIATRIC DISORDERS  Depression     Neuromuscular disease (right femoral nerve injury)    GI/Hepatic Neg liver ROS, hiatal hernia,GERD  Medicated and Controlled,,  Endo/Other  negative endocrine ROS    Renal/GU negative Renal ROS  negative genitourinary   Musculoskeletal  (+) Arthritis , Osteoarthritis,    Abdominal   Peds negative pediatric ROS (+)  Hematology  (+) Blood dyscrasia, anemia   Anesthesia Other Findings   Reproductive/Obstetrics negative OB ROS                             Anesthesia Physical Anesthesia Plan  ASA: 2  Anesthesia Plan: General   Post-op Pain Management: Minimal or no pain anticipated   Induction: Intravenous  PONV Risk Score and Plan: Propofol infusion  Airway Management Planned: Nasal Cannula and Natural Airway  Additional Equipment: None  Intra-op Plan:   Post-operative Plan:   Informed Consent: I have reviewed the patients History and Physical, chart, labs and discussed the procedure including the risks, benefits and alternatives for the proposed anesthesia with the patient or authorized representative who has indicated his/her understanding and acceptance.     Dental advisory given  Plan Discussed with: CRNA  Anesthesia Plan Comments:         Anesthesia Quick Evaluation

## 2023-02-24 NOTE — Op Note (Signed)
[facility] Patient Name: Michelle Jackson Procedure Date: 02/24/2023 9:45 AM MRN: [id number] Date of Birth: 02/25/1946 Attending MD: Hennie Duos. Marletta Lor , [place], [id number] CSN: [id number] Age: 77 Admit Type: Outpatient Procedure:                Upper GI endoscopy Indications:              Dysphagia, Heartburn Providers:                Hennie Duos. Marletta Lor, DO, Buel Ream. Thomasena Edis RN, RN,                            Zena Amos Referring MD:              Medicines:                See the Anesthesia note for documentation of the                            administered medications Complications:            No immediate complications. Estimated Blood Loss:     Estimated blood loss was minimal. Procedure:                Pre-Anesthesia Assessment:                           - The anesthesia plan was to use monitored                            anesthesia care (MAC).                           After obtaining informed consent, the endoscope was                            passed under direct vision. Throughout the                            procedure, the patient's blood pressure, pulse, and                            oxygen saturations were monitored continuously. The                            GIF-H190 ([id number]) scope was introduced through the                            mouth, and advanced to the second part of duodenum.                            The upper GI endoscopy was accomplished without                            difficulty. The patient tolerated the procedure                            well. Scope In: 10:02:59  AM Scope Out: 10:07:41 AM Total Procedure Duration: 0 hours 4 minutes 42 seconds  Findings:      The Z-line was regular.      Significantly altered gastric anatomy with large hiatal hernia was found.      Diffuse mild inflammation characterized by erythema was found in the       entire examined stomach. Biopsies were taken with a cold forceps for       Helicobacter pylori  testing.      A medium amount of food (residue) was found in the entire examined       stomach.      The duodenal bulb, first portion of the duodenum and second portion of       the duodenum were normal. Impression:               - Z-line regular.                           - Large hiatal hernia.                           - Gastritis. Biopsied.                           - A medium amount of food (residue) in the stomach.                           - Normal duodenal bulb, first portion of the                            duodenum and second portion of the duodenum. Moderate Sedation:      Per Anesthesia Care Recommendation:           - Patient has a contact number available for                            emergencies. The signs and symptoms of potential                            delayed complications were discussed with the                            patient. Return to normal activities tomorrow.                            Written discharge instructions were provided to the                            patient.                           - Resume previous diet.                           - Continue present medications.                           - Await pathology results.                           -  Use a proton pump inhibitor PO BID.                           - Significantly altered gastric anatomy with large                            hiatal hernia. Consider upper GI series to further                            evaluate.                           - Moderate amount of food residue found in the                            stomach, query gastroparesis.                           - Technically incomplete examination of stomach                            given food residue                           - Return to GI clinic in 6 weeks. Procedure Code(s):        --- Professional ---                           [phone], Esophagogastroduodenoscopy, flexible,                            transoral; with biopsy,  single or multiple Diagnosis Code(s):        --- Professional ---                           K44.9, Diaphragmatic hernia without obstruction or                            gangrene                           K29.70, Gastritis, unspecified, without bleeding                           R13.10, Dysphagia, unspecified                           R12, Heartburn CPT copyright 2022 American Medical Association. All rights reserved. The codes documented in this report are preliminary and upon coder review may  be revised to meet current compliance requirements. Hennie Duos. Marletta Lor, DO Hennie Duos. Marletta Lor, DO 02/24/2023 10:13:13 AM This report has been signed electronically. Number of Addenda: 0

## 2023-02-24 NOTE — Transfer of Care (Addendum)
Immediate Anesthesia Transfer of Care Note  Patient: BLIMA HUITT  Procedure(s) Performed: ESOPHAGOGASTRODUODENOSCOPY (EGD) WITH PROPOFOL BIOPSY  Patient Location: Endoscopy Unit  Anesthesia Type:General  Level of Consciousness: drowsy and patient cooperative  Airway & Oxygen Therapy: Patient Spontanous Breathing and Patient connected to nasal cannula oxygen  Post-op Assessment: Report given to RN and Post -op Vital signs reviewed and stable  Post vital signs: Reviewed and stable  Last Vitals:  Vitals Value Taken Time  BP 94/44 02/24/23   1012  Temp 36.5 02/24/23   1012  Pulse 53 02/24/23   1012  Resp  02/24/23   1012  SpO2 99% 02/24/23   1012    Last Pain:  Vitals:   02/24/23 1002  TempSrc:   PainSc: 0-No pain      Patients Stated Pain Goal: 5 (02/24/23 0944)  Complications: No notable events documented.

## 2023-02-24 NOTE — Interval H&P Note (Signed)
History and Physical Interval Note:  02/24/2023 9:46 AM  Michelle Jackson  has presented today for surgery, with the diagnosis of dysphagia,GERD.  The various methods of treatment have been discussed with the patient and family. After consideration of risks, benefits and other options for treatment, the patient has consented to  Procedure(s) with comments: ESOPHAGOGASTRODUODENOSCOPY (EGD) WITH PROPOFOL (N/A) - 12:30 pm, asa 2 BALLOON DILATION (N/A) as a surgical intervention.  The patient's history has been reviewed, patient examined, no change in status, stable for surgery.  I have reviewed the patient's chart and labs.  Questions were answered to the patient's satisfaction.     Lanelle Bal

## 2023-02-24 NOTE — Discharge Instructions (Addendum)
EGD Discharge instructions Please read the instructions outlined below and refer to this sheet in the next few weeks. These discharge instructions provide you with general information on caring for yourself after you leave the hospital. Your doctor may also give you specific instructions. While your treatment has been planned according to the most current medical practices available, unavoidable complications occasionally occur. If you have any problems or questions after discharge, please call your doctor. ACTIVITY You may resume your regular activity but move at a slower pace for the next 24 hours.  Take frequent rest periods for the next 24 hours.  Walking will help expel (get rid of) the air and reduce the bloated feeling in your abdomen.  No driving for 24 hours (because of the anesthesia (medicine) used during the test).  You may shower.  Do not sign any important legal documents or operate any machinery for 24 hours (because of the anesthesia used during the test).  NUTRITION Drink plenty of fluids.  You may resume your normal diet.  Begin with a light meal and progress to your normal diet.  Avoid alcoholic beverages for 24 hours or as instructed by your caregiver.  MEDICATIONS You may resume your normal medications unless your caregiver tells you otherwise.  WHAT YOU CAN EXPECT TODAY You may experience abdominal discomfort such as a feeling of fullness or "gas" pains.  FOLLOW-UP Your doctor will discuss the results of your test with you.  SEEK IMMEDIATE MEDICAL ATTENTION IF ANY OF THE FOLLOWING OCCUR: Excessive nausea (feeling sick to your stomach) and/or vomiting.  Severe abdominal pain and distention (swelling).  Trouble swallowing.  Temperature over 101 F (37.8 C).  Rectal bleeding or vomiting of blood.   Your upper endoscopy revealed significantly altered anatomy of your stomach with large hiatal hernia.  There was moderate amount of food residue in your stomach as well  which may be a sign of delayed gastric emptying.  No strictures of your esophagus, I did not need to stretch it today.  Small bowel appeared normal.  I did take samples of your stomach, we will call with these results.  Continue on pantoprazole twice daily.  Follow-up in GI office in 6 weeks.  Office will notify you.   I hope you have a great rest of your week!  Hennie Duos. Marletta Lor, D.O. Gastroenterology and Hepatology [facility]

## 2023-02-24 NOTE — Anesthesia Postprocedure Evaluation (Signed)
Anesthesia Post Note  Patient: Michelle Jackson  Procedure(s) Performed: ESOPHAGOGASTRODUODENOSCOPY (EGD) WITH PROPOFOL BIOPSY  Patient location during evaluation: PACU Anesthesia Type: General Level of consciousness: awake and alert Pain management: pain level controlled Vital Signs Assessment: post-procedure vital signs reviewed and stable Respiratory status: spontaneous breathing, nonlabored ventilation, respiratory function stable and patient connected to nasal cannula oxygen Cardiovascular status: blood pressure returned to baseline and stable Postop Assessment: no apparent nausea or vomiting Anesthetic complications: no   There were no known notable events for this encounter.   Last Vitals:  Vitals:   02/24/23 0944 02/24/23 1012  BP: (!) 142/63 (!) 94/44  Pulse: (!) 58 (!) 53  Resp: 11   Temp: 36.5 C 36.5 C  SpO2: 100% 99%    Last Pain:  Vitals:   02/24/23 1012  TempSrc: Oral  PainSc: 0-No pain                 Jerlene Rockers L Dell Hurtubise

## 2023-02-24 NOTE — Addendum Note (Signed)
Addended by: Armstead Peaks on: 02/24/2023 11:19 AM   Modules accepted: Orders

## 2023-02-25 LAB — SURGICAL PATHOLOGY

## 2023-03-02 ENCOUNTER — Ambulatory Visit (HOSPITAL_COMMUNITY)
Admission: RE | Admit: 2023-03-02 | Discharge: 2023-03-02 | Disposition: A | Discharge status: Home / Self Care | Payer: PPO | Source: Ambulatory Visit | Attending: Internal Medicine | Admitting: Internal Medicine

## 2023-03-02 DIAGNOSIS — R109 Unspecified abdominal pain: Secondary | ICD-10-CM | POA: Insufficient documentation

## 2023-03-02 DIAGNOSIS — R634 Abnormal weight loss: Secondary | ICD-10-CM | POA: Insufficient documentation

## 2023-03-02 DIAGNOSIS — K449 Diaphragmatic hernia without obstruction or gangrene: Secondary | ICD-10-CM | POA: Insufficient documentation

## 2023-03-02 DIAGNOSIS — K224 Dyskinesia of esophagus: Secondary | ICD-10-CM | POA: Diagnosis not present

## 2023-03-04 ENCOUNTER — Encounter (HOSPITAL_COMMUNITY): Payer: Self-pay | Admitting: Internal Medicine

## 2023-04-07 ENCOUNTER — Ambulatory Visit: Payer: PPO | Admitting: Gastroenterology

## 2023-04-07 ENCOUNTER — Encounter: Payer: Self-pay | Admitting: Gastroenterology

## 2023-04-07 ENCOUNTER — Other Ambulatory Visit: Payer: Self-pay | Admitting: Internal Medicine

## 2023-04-07 VITALS — BP 144/68 | HR 52 | Temp 98.0°F | Ht 60.0 in | Wt 84.6 lb

## 2023-04-07 DIAGNOSIS — K449 Diaphragmatic hernia without obstruction or gangrene: Secondary | ICD-10-CM

## 2023-04-07 DIAGNOSIS — R1310 Dysphagia, unspecified: Secondary | ICD-10-CM

## 2023-04-07 DIAGNOSIS — K219 Gastro-esophageal reflux disease without esophagitis: Secondary | ICD-10-CM

## 2023-04-07 DIAGNOSIS — I10 Essential (primary) hypertension: Secondary | ICD-10-CM

## 2023-04-07 NOTE — Patient Instructions (Signed)
 If you up to feel up to it, you can decrease the sucralfate  to twice a day.   Continue pantoprazole  twice a day.   Return in 6 months or sooner if needed.   It was a pleasure to see you today. I want to create trusting relationships with patients and provide genuine, compassionate, and quality care. I truly value your feedback, so please be on the lookout for a survey regarding your visit with me today. I appreciate your time in completing this!         Therisa MICAEL Stager, PhD, ANP-BC [facility]

## 2023-04-07 NOTE — Progress Notes (Signed)
 GI Office Note    Referring Provider: Melvenia Manus BRAVO, MD Primary Care Physician:  Melvenia Manus BRAVO, MD  Primary Gastroenterologist: Carlin POUR. Cindie, DO   Chief Complaint   Chief Complaint  Patient presents with   Follow-up    Doing better    History of Present Illness   Michelle Jackson is a 78 y.o. female presenting today in follow-up for dysphagia, known chronic GERD, recently undergoing EGD and UGI as noted below.   Sucralfate  QID has helped with swallowing. Still at times has problems with fried foods. Much improved now on medication. Pantoprazole  BID. Vomits maybe once a week. Thinks may eat too much. Sometimes gets the heaves. Patient states not really throwing up. Declining nausea medication. Declining GES. Feels improved and wants to do supportive measures.   UGI series 03/02/23: IMPRESSION: 1. Abnormal appearance of the gastric fundus most compatible with unwrapped Nissen fundoplication. 2. Small to moderate-sized recurrent/residual hiatal hernia. 3. Moderate-to-large sized wide mouth diverticulum involving the distal aspect of the esophagus, superior to the GE junction. 4. Mild-to-moderate esophageal dysmotility. 5. No significant gastroesophageal reflux was visualized or elicited.  EGD 02/2023: -large hiatal hernia -gastritis, no h.pylori -medium amt of food residue in stomach, technically incomplete EGD.   Colonoscopy August 2023: -Diverticulosis in the sigmoid colon and descending colon -two 1 to 2 mm polyps in the transverse colon removed, tubular adenomas -Surveillance colonoscopy in 5 years     Medications   Current Outpatient Medications  Medication Sig Dispense Refill   amLODipine  (NORVASC ) 10 MG tablet TAKE 1 TABLET BY MOUTH EVERY DAY 90 tablet 1   Apoaequorin (PREVAGEN PO) Take 1 tablet by mouth daily.     Calcium  Carbonate Antacid (CALCIUM  CARBONATE PO) Take 1,200 mg by mouth daily.     Calcium  Carbonate-Vit D-Min (CALCIUM  1200 PO)  Take 600 mg by mouth 2 (two) times daily. Magnesium  Zinc Copper Manganese sulfate     Cholecalciferol (VITAMIN D ) 50 MCG (2000 UT) CAPS Take 2,000 Units by mouth daily.     Ginkgo Biloba 120 MG TABS Take 120 mg by mouth daily.     Glycerin-Hypromellose-PEG 400 (DRY EYE RELIEF DROPS OP) Place 1 drop into both eyes daily as needed (Dry eyes).     Lysine 1000 MG TABS Take 1,000 mg by mouth daily.     Magnesium 200 MG TABS Take 400 mg by mouth daily. Gummy     Melatonin 10 MG TABS Take 20 mg by mouth at bedtime. Gummy     Multiple Vitamin (MULTIVITAMIN) tablet Take 1 tablet by mouth daily. Woman's 50+ With Iron     Omega-3 Fatty Acids (FISH OIL) 1200 MG CAPS Take 1,200 mg by mouth 2 (two) times daily. 2 daily     pantoprazole  (PROTONIX ) 40 MG tablet TAKE 1 TABLET BY MOUTH TWICE A DAY 180 tablet 0   sucralfate  (CARAFATE ) 1 GM/10ML suspension Take 10 mLs (1 g total) by mouth 4 (four) times daily. 1200 mL 11   valsartan  (DIOVAN ) 320 MG tablet TAKE 1 TABLET BY MOUTH EVERY DAY 90 tablet 2   No current facility-administered medications for this visit.    Allergies   Allergies as of 04/07/2023 - Review Complete 04/07/2023  Allergen Reaction Noted   Iodinated contrast media Hives 10/25/2010   Other Hives 12/29/2011   Tape Other (See Comments) 01/22/2011     Past Medical History   Past Medical History:  Diagnosis Date   GERD (gastroesophageal reflux disease)  Hypertension    Neuromuscular disorder (HCC)    femoral nerve severed during hernia surgery; wears right knee brace    Past Surgical History   Past Surgical History:  Procedure Laterality Date   BIOPSY  02/24/2023   Procedure: BIOPSY;  Surgeon: Cindie Carlin POUR, DO;  [facility];  Service: Endoscopy;;   COLONOSCOPY WITH PROPOFOL  N/A 11/05/2021   Procedure: COLONOSCOPY WITH PROPOFOL ;  Surgeon: Cindie Carlin POUR, DO;  [facility];  Service: Endoscopy;  Laterality: N/A;  11:00 ASA 2    ESOPHAGOGASTRODUODENOSCOPY (EGD) WITH PROPOFOL  N/A 02/24/2023   Procedure: ESOPHAGOGASTRODUODENOSCOPY (EGD) WITH PROPOFOL ;  Surgeon: Cindie Carlin POUR, DO;  Location: [facility];  Service: Endoscopy;  Laterality: N/A;  12:30 pm, asa 2   HERNIA REPAIR     POLYPECTOMY  11/05/2021   Procedure: POLYPECTOMY;  Surgeon: Cindie Carlin POUR, DO;  Location: [facility];  Service: Endoscopy;;    Past Family History   Family History  Problem Relation Age of Onset   Colon cancer Neg Hx    Breast cancer Neg Hx     Past Social History   Social History   Socioeconomic History   Marital status: Divorced    Spouse name: Not on file   Number of children: 0   Years of education: Not on file   Highest education level: 12th grade  Occupational History   Not on file  Tobacco Use   Smoking status: Former    Types: Cigarettes   Smokeless tobacco: Never  Vaping Use   Vaping status: Never Used  Substance and Sexual Activity   Alcohol use: Yes    Alcohol/week: 1.0 standard drink of alcohol    Types: 1 Glasses of wine per week    Comment: occassional; social   Drug use: Never   Sexual activity: Yes    Birth control/protection: Post-menopausal  Other Topics Concern   Not on file  Social History Narrative   Lives alone but pt states she has a current boyfriend and close friends and neighbors.   Social Drivers of Corporate Investment Banker Strain: Low Risk  (02/23/2023)   Overall Financial Resource Strain (CARDIA)    Difficulty of Paying Living Expenses: Not hard at all  Food Insecurity: No Food Insecurity (02/23/2023)   Hunger Vital Sign    Worried About Running Out of Food in the Last Year: Never true    Ran Out of Food in the Last Year: Never true  Transportation Needs: No Transportation Needs (02/23/2023)   PRAPARE - Administrator, Civil Service (Medical): No    Lack of Transportation (Non-Medical): No  Physical Activity: Inactive (02/23/2023)   Exercise Vital Sign    Days  of Exercise per Week: 0 days    Minutes of Exercise per Session: 0 min  Stress: No Stress Concern Present (02/23/2023)   [employer] of Occupational Health - Occupational Stress Questionnaire    Feeling of Stress : Not at all  Social Connections: Moderately Isolated (02/23/2023)   Social Connection and Isolation Panel [NHANES]    Frequency of Communication with Friends and Family: More than three times a week    Frequency of Social Gatherings with Friends and Family: Once a week    Attends Religious Services: Never    Database Administrator or Organizations: No    Attends Banker Meetings: Never    Marital Status: Married  Catering Manager Violence: Not At Risk (02/23/2023)  Humiliation, Afraid, Rape, and Kick questionnaire    Fear of Current or Ex-Partner: No    Emotionally Abused: No    Physically Abused: No    Sexually Abused: No    Review of Systems   General: Negative for anorexia, weight loss, fever, chills, fatigue, weakness. ENT: Negative for hoarseness, difficulty swallowing , nasal congestion. CV: Negative for chest pain, angina, palpitations, dyspnea on exertion, peripheral edema.  Respiratory: Negative for dyspnea at rest, dyspnea on exertion, cough, sputum, wheezing.  GI: See history of present illness. GU:  Negative for dysuria, hematuria, urinary incontinence, urinary frequency, nocturnal urination.  Endo: Negative for unusual weight change.     Physical Exam   BP (!) 144/68 (BP Location: Right Arm, Patient Position: Sitting, Cuff Size: Normal)   Pulse (!) 52   Temp 98 F (36.7 C) (Oral)   Ht 5' (1.524 m)   Wt 84 lb 9.6 oz (38.4 kg)   LMP  (LMP Unknown)   BMI 16.52 kg/m    General: Well-nourished, well-developed in no acute distress.  Eyes: No icterus.  Abdomen: Bowel sounds are normal, nontender, nondistended, no hepatosplenomegaly or masses,  no abdominal bruits or hernia , no rebound or guarding.  Rectal: deffered  Extremities: No  lower extremity edema. No clubbing or deformities. Neuro: Alert and oriented x 4   Skin: Warm and dry, no jaundice.   Psych: Alert and cooperative, normal mood and affect.    Assessment    78 y.o. female presenting today in follow-up for dysphagia, known chronic GERD, recently undergoing EGD and UGI as noted above.  Dysphagia secondary to large hiatal hernia, but she notes improvement with BID PPI therapy and Carafate . Notably, suspect she has underlying gastroparesis as culprit for intermittent vomiting; however, she is stating this is rare and would rather pursue conservative measures. If persistent dysphagia, could consider manometry as she did have mild/moderate esophageal dysmotility and referral for potential hernia repair. She is declining further evaluation and would like to continue current regimen.      PLAN   PPI BID, continue carafate  Dietary modification/behavior changes Call if worsening Colonoscopy in 2028 6 month return

## 2023-04-13 ENCOUNTER — Ambulatory Visit: Payer: PPO | Admitting: Gastroenterology

## 2023-04-27 ENCOUNTER — Encounter: Payer: Self-pay | Admitting: Internal Medicine

## 2023-04-27 ENCOUNTER — Ambulatory Visit (HOSPITAL_COMMUNITY)
Admission: RE | Admit: 2023-04-27 | Discharge: 2023-04-27 | Disposition: A | Discharge status: Home / Self Care | Payer: PPO | Source: Ambulatory Visit | Attending: Internal Medicine | Admitting: Internal Medicine

## 2023-04-27 ENCOUNTER — Other Ambulatory Visit: Payer: Self-pay | Admitting: Internal Medicine

## 2023-04-27 ENCOUNTER — Ambulatory Visit (HOSPITAL_COMMUNITY)
Admission: RE | Admit: 2023-04-27 | Discharge: 2023-04-27 | Disposition: A | Discharge status: Home / Self Care | Payer: PPO | Source: Ambulatory Visit | Attending: Internal Medicine

## 2023-04-27 ENCOUNTER — Encounter (HOSPITAL_COMMUNITY): Payer: Self-pay

## 2023-04-27 DIAGNOSIS — Z1231 Encounter for screening mammogram for malignant neoplasm of breast: Secondary | ICD-10-CM | POA: Insufficient documentation

## 2023-04-27 DIAGNOSIS — Z0000 Encounter for general adult medical examination without abnormal findings: Secondary | ICD-10-CM

## 2023-04-27 DIAGNOSIS — Z780 Asymptomatic menopausal state: Secondary | ICD-10-CM

## 2023-04-27 DIAGNOSIS — M810 Age-related osteoporosis without current pathological fracture: Secondary | ICD-10-CM | POA: Diagnosis not present

## 2023-04-27 DIAGNOSIS — M8589 Other specified disorders of bone density and structure, multiple sites: Secondary | ICD-10-CM | POA: Diagnosis not present

## 2023-04-27 MED ORDER — DENOSUMAB 60 MG/ML ~~LOC~~ SOSY: 60.0000 mg | PREFILLED_SYRINGE | SUBCUTANEOUS | 0 refills | Status: DC

## 2023-05-15 ENCOUNTER — Telehealth: Payer: Self-pay

## 2023-05-15 ENCOUNTER — Telehealth: Payer: Self-pay | Admitting: Internal Medicine

## 2023-05-15 NOTE — Telephone Encounter (Signed)
Prolia injection is here in office, patient to come in for a nurse visit

## 2023-05-15 NOTE — Telephone Encounter (Signed)
Copied from CRM [id number]. Topic: Clinical - Prescription Issue >> May 15, 2023  8:50 AM Michelle Jackson wrote: Reason for CRM: Pt states her pharmacy is out of denosumab (PROLIA) 60 MG/ML SOSY injection and wants to know if there is an alternative tha cam be prescribed or what her other options? Callback # [phone]

## 2023-05-15 NOTE — Telephone Encounter (Signed)
Copied from CRM [id number]. Topic: Appointments - Scheduling Inquiry for Clinic >> May 15, 2023 12:10 PM Michelle Jackson E wrote: Reason for CRM: Pt called reporting that she is supposed to be scheduled for Prolia injections. Pt wants more info please advise

## 2023-05-15 NOTE — Telephone Encounter (Signed)
LVM to schedule pt nurse visit

## 2023-05-18 ENCOUNTER — Ambulatory Visit: Payer: PPO

## 2023-05-18 DIAGNOSIS — M810 Age-related osteoporosis without current pathological fracture: Secondary | ICD-10-CM

## 2023-05-18 MED ORDER — DENOSUMAB 60 MG/ML ~~LOC~~ SOSY: 60.0000 mg | PREFILLED_SYRINGE | Freq: Once | SUBCUTANEOUS | Status: DC

## 2023-05-19 ENCOUNTER — Telehealth: Payer: Self-pay | Admitting: Internal Medicine

## 2023-05-19 NOTE — Telephone Encounter (Signed)
 Copied from CRM [id number]. Topic: General - Other >> May 19, 2023 11:10 AM Eunice Blase wrote: Reason for CRM: Pt called would like a call back to discuss Polia shot frequency. Please call pt at [phone].

## 2023-05-19 NOTE — Telephone Encounter (Signed)
 Patient was informed to come in for a nurse visit

## 2023-05-19 NOTE — Telephone Encounter (Unsigned)
 Copied from CRM [id number]. Topic: Clinical - Medical Advice >> May 19, 2023  2:03 PM Gildardo Pounds wrote: Reason for CRM: Patient needs to know how long to wait to get another Prolia shot. She had a shot on 05/18/2023. When should she get the next shot? Callback number is [phone]

## 2023-05-19 NOTE — Telephone Encounter (Signed)
 Can nurse return patient call to let her know next appointment needed for this shot.    Copied from CRM [id number]. Topic: Appointments - Scheduling Inquiry for Clinic >> May 19, 2023 11:53 AM Higinio Roger wrote: Patient needs a callback to schedule Prolia shot. According to appointment notes, it says next shot should me 3/10. I attempted to book appointment under injections and was told I did not have access. Callback #: [phone]

## 2023-05-20 ENCOUNTER — Telehealth: Payer: Self-pay

## 2023-05-20 ENCOUNTER — Other Ambulatory Visit: Payer: Self-pay | Admitting: Internal Medicine

## 2023-05-20 DIAGNOSIS — I10 Essential (primary) hypertension: Secondary | ICD-10-CM

## 2023-05-20 NOTE — Telephone Encounter (Signed)
 Copied from CRM [id number]. Topic: Clinical - Prescription Issue >> May 20, 2023  9:59 AM Eunice Blase wrote: Reason for CRM:  PT wanted to check on the status of her denosumab (PROLIA) 60 MG/ML SOSY injection. PT was advised to visit the office for a nurse visit to have this injection completed.Pt states it was not sent to doctor's office. Please call pt [phone] to advise on who and where to call to check on medication.

## 2023-05-20 NOTE — Telephone Encounter (Signed)
 Spoke to patient , patient is confused, patient came in on 05/18/2023 for injection

## 2023-05-26 NOTE — Telephone Encounter (Signed)
 Pt called in about Prolia shost she wrote on her calendar to be scheduled, for Mar 11. I told her I dont show this, but she wants cb anyway to confirm this

## 2023-05-26 NOTE — Telephone Encounter (Signed)
 Wants to make sure she is only needing one vaccine that was received 2/24 Please advise patient

## 2023-05-26 NOTE — Telephone Encounter (Signed)
 Patient advised.

## 2023-05-26 NOTE — Telephone Encounter (Signed)
 Patient advised she does not need another prolia until [employer]

## 2023-06-23 IMAGING — MR MR HEAD W/O CM
13 of 14 series · 37 of 48 positions shown · non-contrast
Comparison: No pertinent prior exams available for comparison.

CLINICAL DATA: Memory impairment GGW.P ([id number]). Additional
history provided by technologist: Memory loss for 8 months.

EXAM:
MRI HEAD WITHOUT CONTRAST
TECHNIQUE: Multiplanar, multiecho pulse sequences of the brain and surrounding
structures were obtained without intravenous contrast.

[Series 5: DWI · axial · 3.0mm · 0.77mm/px · z∈[-115,+26]mm · 4 of 48 slices shown (1 of 6)]
[im 1/48]
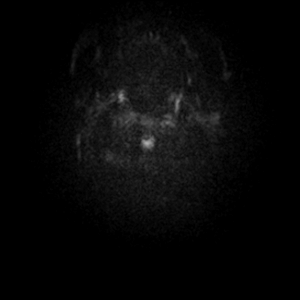
[im 16/48]
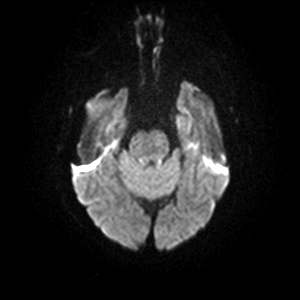
[im 32/48]
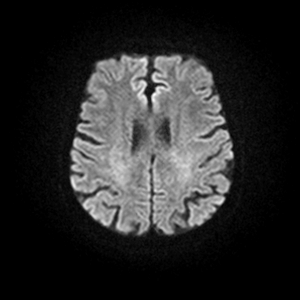
[im 48/48]
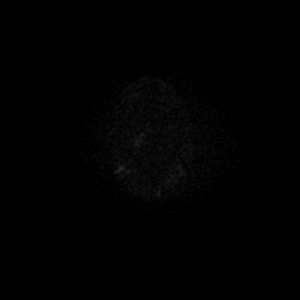

[Series 5: DWI · axial · 3.0mm · 0.77mm/px · z∈[-115,+26]mm · 4 of 48 slices shown (2 of 6)]
[im 1/48]
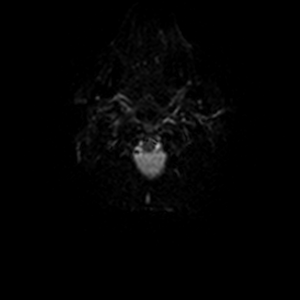
[im 16/48]
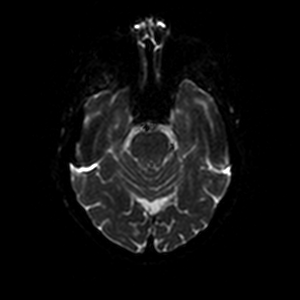
[im 32/48]
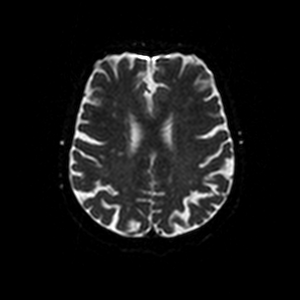
[im 48/48]
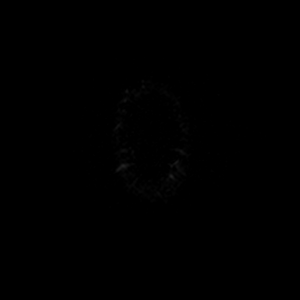

[Series 6: DWI · axial · 3.0mm · 0.77mm/px · z∈[-115,+26]mm · 4 of 48 slices shown (3 of 6)]
[im 1/48]
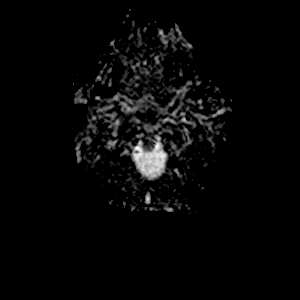
[im 16/48]
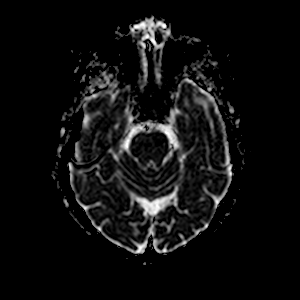
[im 32/48]
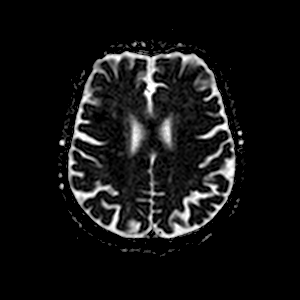
[im 48/48]
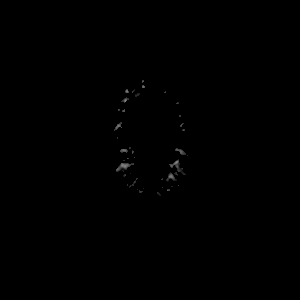

[Series 7: DWI · coronal · 5.0mm · 0.88mm/px · 2 of 28 slices shown (4 of 6)]
[im 1/28]
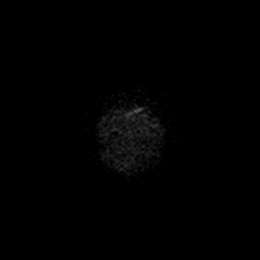
[im 28/28]
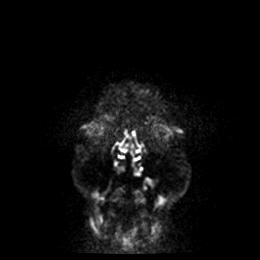

[Series 7: DWI · coronal · 5.0mm · 0.88mm/px · 2 of 28 slices shown (5 of 6)]
[im 1/28]
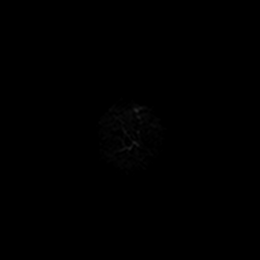
[im 28/28]
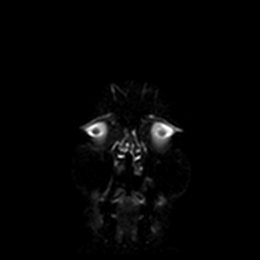

[Series 8: DWI · coronal · 5.0mm · 0.88mm/px · 2 of 28 slices shown (6 of 6)]
[im 1/28]
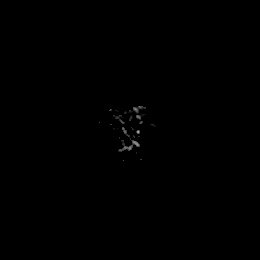
[im 28/28]
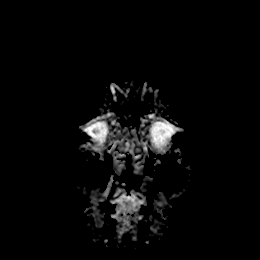

[Series 9: T1 · sagittal · 5.0mm · 0.75mm/px · 1 of 19 slices shown]
[im 1/19]
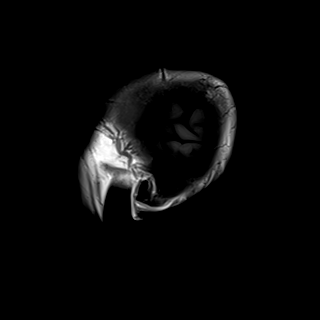

[Series 10: T2 · axial · 5.0mm · 0.72mm/px · 1 of 20 slices shown (1 of 2)]
[im 1/20]
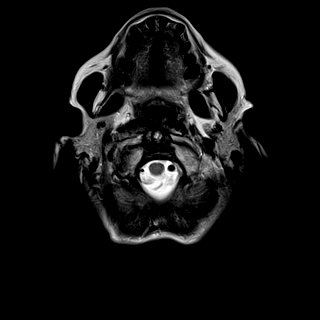

[Series 11: mag_images · axial · 3.0mm · 0.90mm/px · z∈[-122,+55]mm · 4 of 60 slices shown]
[im 1/60]
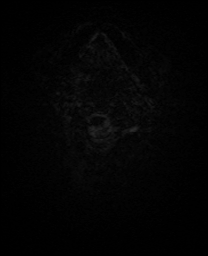
[im 20/60]
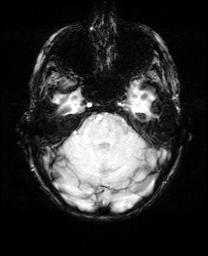
[im 40/60]
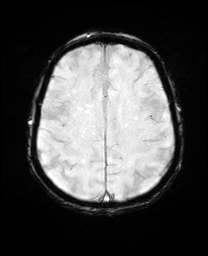
[im 60/60]
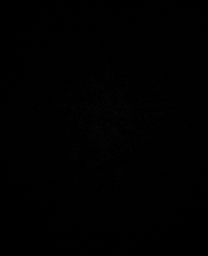

[Series 12: pha_images · axial · 3.0mm · 0.90mm/px · z∈[-122,+55]mm · 4 of 57 slices shown]
[im 1/57]
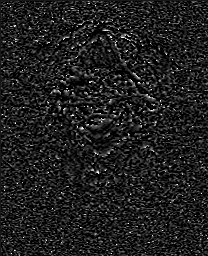
[im 19/57]
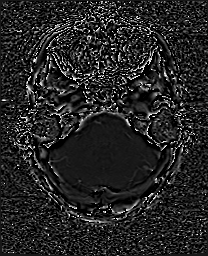
[im 38/57]
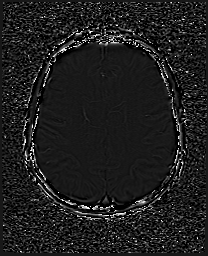
[im 57/57]
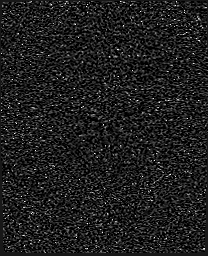

[Series 13: swi_images · axial · 3.0mm · 0.90mm/px · z∈[-122,+55]mm · 4 of 60 slices shown]
[im 1/60]
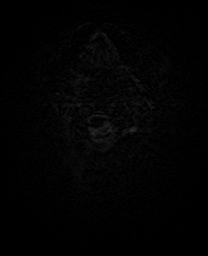
[im 20/60]
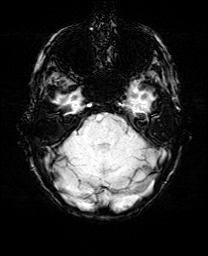
[im 40/60]
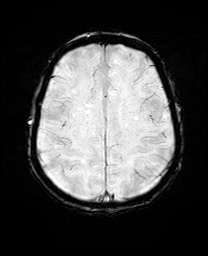
[im 60/60]
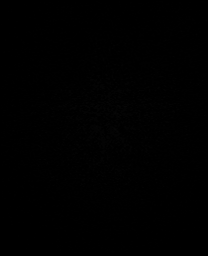

[Series 15: FLAIR · axial · 3.0mm · 0.45mm/px · z∈[-92,+25]mm · 3 of 40 slices shown]
[im 1/40]
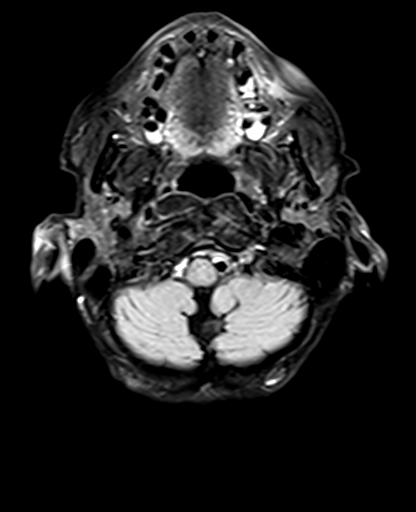
[im 20/40]
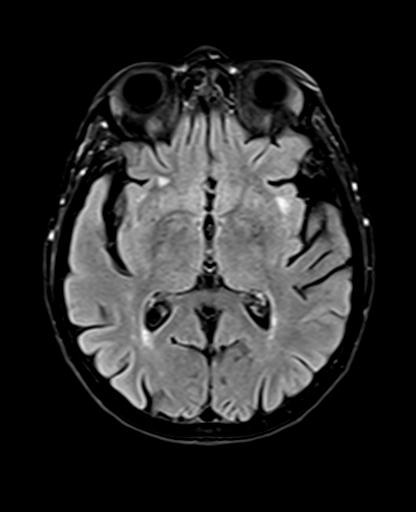
[im 40/40]
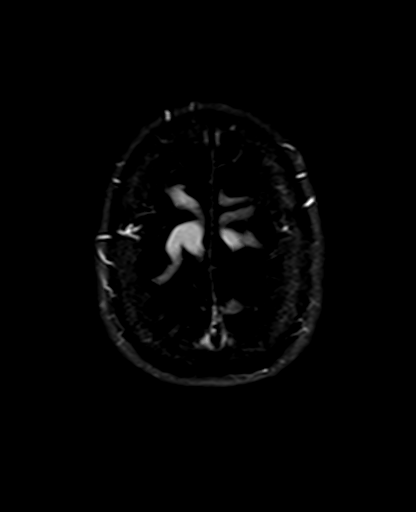

[Series 17: T2 · coronal · 5.0mm · 0.72mm/px · 2 of 28 slices shown (2 of 2)]
[im 1/28]
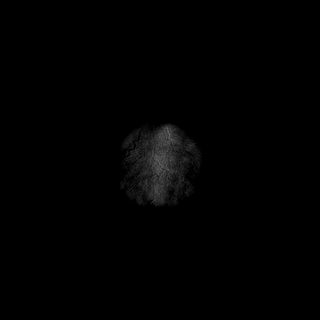
[im 28/28]
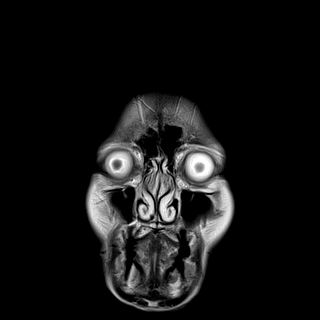

[37 of 48 positions shown; findings below may reference images not displayed]

FINDINGS: Brain:

Mild generalized cerebral and cerebellar atrophy.

Mild multifocal T2/FLAIR hyperintensity within the cerebral white
matter, nonspecific but compatible chronic small vessel ischemic
disease.

1.4 x 0.7 cm chronic infarct within the right cerebellar hemisphere.

There is no acute infarct.

No evidence of an intracranial mass.

No chronic intracranial blood products.

No extra-axial fluid collection.

No midline shift.

Vascular: Expected proximal arterial flow voids.

Skull and upper cervical spine: No focal suspicious marrow lesion.
Incompletely assessed cervical spondylosis. Ligamentous
hypertrophy/pannus formation posterior to the dens. C3-C4 grade 1
anterolisthesis.

Sinuses/Orbits: Visualized orbits show no acute finding. No
significant paranasal sinus disease.
IMPRESSION: No evidence of acute intracranial abnormality.

Mild chronic small vessel ischemic changes within the cerebral white
matter.

1.4 cm chronic infarct within the right cerebellar hemisphere.

Mild generalized cerebral and cerebellar atrophy.

Incompletely assessed cervical spondylosis. C3-C4 grade 1
anterolisthesis.

## 2023-07-01 ENCOUNTER — Other Ambulatory Visit: Payer: Self-pay | Admitting: Internal Medicine

## 2023-07-01 MED ORDER — PANTOPRAZOLE SODIUM 40 MG PO TBEC: 40.0000 mg | DELAYED_RELEASE_TABLET | Freq: Two times a day (BID) | ORAL | 0 refills | Status: DC

## 2023-07-01 NOTE — Telephone Encounter (Signed)
 Copied from CRM [id number]. Topic: Clinical - Medication Refill >> Jul 01, 2023  9:20 AM Freda Munro wrote: Most Recent Primary Care Visit:  Provider: Herbie Saxon  Department: [facility]  Visit Type: CLINICAL SUPPORT  Date: 05/18/2023  Medication: pantoprazole (PROTONIX) 40 MG tablet  Has the patient contacted their pharmacy? Yes (Agent: If no, request that the patient contact the pharmacy for the refill. If patient does not wish to contact the pharmacy document the reason why and proceed with request.) (Agent: If yes, when and what did the pharmacy advise?)  Is this the correct pharmacy for this prescription? Yes If no, delete pharmacy and type the correct one.  This is the patient's preferred pharmacy:  [id number] - [place], [place] - [address] AT [facility] [address] [place] [address] Phone: [phone] Fax: [phone]   Has the prescription been filled recently? No  Is the patient out of the medication? Yes  Has the patient been seen for an appointment in the last year OR does the patient have an upcoming appointment? No  Can we respond through MyChart? Yes  Agent: Please be advised that Rx refills may take up to 3 business days. We ask that you follow-up with your pharmacy.

## 2023-07-31 ENCOUNTER — Encounter: Payer: Self-pay | Admitting: Internal Medicine

## 2023-07-31 ENCOUNTER — Ambulatory Visit (INDEPENDENT_AMBULATORY_CARE_PROVIDER_SITE_OTHER): Payer: PPO | Admitting: Internal Medicine

## 2023-07-31 VITALS — BP 117/64 | HR 57 | Ht 60.0 in | Wt 82.2 lb

## 2023-07-31 DIAGNOSIS — I10 Essential (primary) hypertension: Secondary | ICD-10-CM

## 2023-07-31 DIAGNOSIS — M810 Age-related osteoporosis without current pathological fracture: Secondary | ICD-10-CM | POA: Diagnosis not present

## 2023-07-31 DIAGNOSIS — Z0001 Encounter for general adult medical examination with abnormal findings: Secondary | ICD-10-CM

## 2023-07-31 NOTE — Patient Instructions (Signed)
 It was a pleasure to see you today.  Thank you for giving us  the opportunity to be involved in your care.  Below is a brief recap of your visit and next steps.  We will plan to see you again in 6 months.  Summary Annual physical today No medication changes  Repeat labs  Follow up in 6 months

## 2023-07-31 NOTE — Progress Notes (Signed)
 Complete physical exam  Patient: Michelle Jackson   DOB: 06/10/1945   77 y.o. Female  MRN: [id number]  Subjective:     Chief Complaint  Patient presents with   Annual Exam    Michelle Jackson is a 78 y.o. female who presents today for a complete physical exam. She reports consuming a general diet. The patient does not participate in regular exercise at present. She generally feels fairly well. She reports sleeping well. She does not have additional problems to discuss today.    Most recent fall risk assessment:    07/31/2023    2:44 PM  Fall Risk   Falls in the past year? 1  Number falls in past yr: 0  Injury with Fall? 1  Risk for fall due to : Other (Comment)  Risk for fall due to: Comment age  Follow up Falls evaluation completed;Education provided;Falls prevention discussed     Most recent depression screenings:    07/31/2023    2:45 PM 02/23/2023   10:45 AM  PHQ 2/9 Scores  PHQ - 2 Score 0 0  PHQ- 9 Score 0     Vision:Within last year and Dental: No current dental problems and Receives regular dental care  Past Medical History:  Diagnosis Date   GERD (gastroesophageal reflux disease)    Hypertension    Neuromuscular disorder (HCC)    femoral nerve severed during hernia surgery; wears right knee brace   Past Surgical History:  Procedure Laterality Date   BIOPSY  02/24/2023   Procedure: BIOPSY;  Surgeon: Vinetta Greening, DO;  [facility];  Service: Endoscopy;;   COLONOSCOPY WITH PROPOFOL  N/A 11/05/2021   Procedure: COLONOSCOPY WITH PROPOFOL ;  Surgeon: Vinetta Greening, DO;  [facility];  Service: Endoscopy;  Laterality: N/A;  11:00 ASA 2   ESOPHAGOGASTRODUODENOSCOPY (EGD) WITH PROPOFOL  N/A 02/24/2023   Procedure: ESOPHAGOGASTRODUODENOSCOPY (EGD) WITH PROPOFOL ;  Surgeon: Vinetta Greening, DO;  [facility];  Service: Endoscopy;  Laterality: N/A;  12:30 pm, asa 2   HERNIA REPAIR     POLYPECTOMY  11/05/2021   Procedure: POLYPECTOMY;   Surgeon: Vinetta Greening, DO;  [facility]: [facility];  Service: Endoscopy;;   Social History   Tobacco Use   Smoking status: Former    Types: Cigarettes   Smokeless tobacco: Never  Vaping Use   Vaping status: Never Used  Substance Use Topics   Alcohol use: Yes    Alcohol/week: 1.0 standard drink of alcohol    Types: 1 Glasses of wine per week    Comment: occassional; social   Drug use: Never   Family History  Problem Relation Age of Onset   Colon cancer Neg Hx    Breast cancer Neg Hx    Allergies  Allergen Reactions   Iodinated Contrast Media Hives   Other Hives    IVP Dye     Tape Other (See Comments)    Pt states she does better with paper tape      Patient Care Team: Tobi Fortes, MD as PCP - General (Internal Medicine) Vinetta Greening, DO as Consulting Physician (Gastroenterology)   Outpatient Medications Prior to Visit  Medication Sig   Apoaequorin (PREVAGEN PO) Take 1 tablet by mouth daily.   Calcium  Carbonate Antacid (CALCIUM  CARBONATE PO) Take 1,200 mg by mouth daily.   Calcium  Carbonate-Vit D-Min (CALCIUM  1200 PO) Take 600 mg by mouth 2 (two) times daily. Magnesium  Zinc Copper Manganese sulfate  Cholecalciferol (VITAMIN D ) 50 MCG (2000 UT) CAPS Take 2,000 Units by mouth daily.   denosumab  (PROLIA ) 60 MG/ML SOSY injection Inject 60 mg into the skin every 6 (six) months.   Ginkgo Biloba 120 MG TABS Take 120 mg by mouth daily.   Glycerin-Hypromellose-PEG 400 (DRY EYE RELIEF DROPS OP) Place 1 drop into both eyes daily as needed (Dry eyes).   Lysine 1000 MG TABS Take 1,000 mg by mouth daily.   Magnesium 200 MG TABS Take 400 mg by mouth daily. Gummy   Melatonin 10 MG TABS Take 20 mg by mouth at bedtime. Gummy   Multiple Vitamin (MULTIVITAMIN) tablet Take 1 tablet by mouth daily. Woman's 50+ With Iron   Omega-3 Fatty Acids (FISH OIL) 1200 MG CAPS Take 1,200 mg by mouth 2 (two) times daily. 2 daily   pantoprazole  (PROTONIX ) 40 MG tablet Take 1  tablet (40 mg total) by mouth 2 (two) times daily.   sucralfate  (CARAFATE ) 1 GM/10ML suspension Take 10 mLs (1 g total) by mouth 4 (four) times daily.   valsartan  (DIOVAN ) 320 MG tablet TAKE 1 TABLET BY MOUTH EVERY DAY   [DISCONTINUED] amLODipine  (NORVASC ) 10 MG tablet TAKE 1 TABLET BY MOUTH EVERY DAY   Facility-Administered Medications Prior to Visit  Medication Dose Route Frequency Provider   denosumab  (PROLIA ) injection 60 mg  60 mg Subcutaneous Once Tobi Fortes, MD   Review of Systems  Constitutional:  Negative for chills and fever.  HENT:  Negative for sore throat.   Respiratory:  Negative for cough and shortness of breath.   Cardiovascular:  Negative for chest pain, palpitations and leg swelling.  Gastrointestinal:  Negative for abdominal pain, blood in stool, constipation, diarrhea, nausea and vomiting.  Genitourinary:  Negative for dysuria and hematuria.  Musculoskeletal:  Positive for back pain (chronic lumbar back pain). Negative for myalgias.  Skin:  Negative for itching and rash.  Neurological:  Negative for dizziness and headaches.  Psychiatric/Behavioral:  Negative for depression and suicidal ideas.    Objective:     BP 117/64   Pulse (!) 57   Ht 5' (1.524 m)   Wt 82 lb 3.2 oz (37.3 kg)   LMP  (LMP Unknown)   SpO2 95%   BMI 16.05 kg/m  BP Readings from Last 3 Encounters:  07/31/23 117/64  04/07/23 (!) 144/68  02/24/23 (!) 94/44   Physical Exam Vitals reviewed.  Constitutional:      General: She is not in acute distress.    Appearance: Normal appearance. She is not toxic-appearing.  HENT:     Head: Normocephalic and atraumatic.     Right Ear: External ear normal.     Left Ear: External ear normal.     Nose: Nose normal. No congestion or rhinorrhea.     Mouth/Throat:     Mouth: Mucous membranes are moist.     Pharynx: Oropharynx is clear. No oropharyngeal exudate or posterior oropharyngeal erythema.  Eyes:     General: No scleral icterus.     Extraocular Movements: Extraocular movements intact.     Conjunctiva/sclera: Conjunctivae normal.     Pupils: Pupils are equal, round, and reactive to light.  Cardiovascular:     Rate and Rhythm: Normal rate and regular rhythm.     Pulses: Normal pulses.     Heart sounds: Normal heart sounds. No murmur heard.    No friction rub. No gallop.  Pulmonary:     Effort: Pulmonary effort is normal.     Breath  sounds: Normal breath sounds. No wheezing, rhonchi or rales.  Abdominal:     General: Abdomen is flat. Bowel sounds are normal. There is no distension.     Palpations: Abdomen is soft.     Tenderness: There is no abdominal tenderness.  Musculoskeletal:        General: No swelling.     Cervical back: Normal range of motion.     Right lower leg: No edema.     Left lower leg: No edema.     Comments: Brace on right knee  Lymphadenopathy:     Cervical: No cervical adenopathy.  Skin:    General: Skin is warm and dry.     Capillary Refill: Capillary refill takes less than 2 seconds.     Coloration: Skin is not jaundiced.  Neurological:     General: No focal deficit present.     Mental Status: She is alert and oriented to person, place, and time.  Psychiatric:        Mood and Affect: Mood normal.        Behavior: Behavior normal.   Last CBC Lab Results  Component Value Date   WBC 6.1 07/31/2023   HGB 12.7 07/31/2023   HCT 39.5 07/31/2023   MCV 94 07/31/2023   MCH 30.3 07/31/2023   RDW 13.5 07/31/2023   PLT 291 07/31/2023   Last metabolic panel Lab Results  Component Value Date   GLUCOSE 94 07/31/2023   NA 138 07/31/2023   K 4.6 07/31/2023   CL 100 07/31/2023   CO2 22 07/31/2023   BUN 27 07/31/2023   CREATININE 1.24 (H) 07/31/2023   EGFR 45 (L) 07/31/2023   CALCIUM  9.6 07/31/2023   PROT 6.8 07/31/2023   ALBUMIN 4.6 07/31/2023   LABGLOB 2.2 07/31/2023   AGRATIO 1.7 07/28/2022   BILITOT 0.7 07/31/2023   ALKPHOS 57 07/31/2023   AST 29 07/31/2023   ALT 17 07/31/2023    Last lipids Lab Results  Component Value Date   CHOL 221 (H) 07/31/2023   HDL 73 07/31/2023   LDLCALC 135 (H) 07/31/2023   TRIG 74 07/31/2023   CHOLHDL 3.0 07/31/2023   Last hemoglobin A1c Lab Results  Component Value Date   HGBA1C 5.9 (H) 07/31/2023   Last thyroid  functions Lab Results  Component Value Date   TSH 1.050 07/31/2023   Last vitamin D  Lab Results  Component Value Date   VD25OH 134.0 (H) 07/31/2023   Last vitamin B12 and Folate Lab Results  Component Value Date   VITAMINB12 1,453 (H) 07/31/2023   FOLATE >20.0 07/31/2023      Assessment & Plan:    Routine Health Maintenance and Physical Exam  Immunization History  Administered Date(s) Administered   Fluad Quad(high Dose 65+) 11/29/2015, 12/20/2017, 12/02/2020, 12/14/2021   Fluad Trivalent(High Dose 65+) 12/13/2022   Influenza Split 12/29/2011, 11/25/2013   Influenza, High Dose Seasonal PF 12/05/2012, 12/12/2014, 12/01/2016, 12/20/2017, 12/20/2018, 11/23/2019   Influenza,inj,quad, With Preservative 12/05/2012   Influenza-Unspecified 01/08/2011   Moderna Covid-19 Vaccine Bivalent Booster 2yrs & up 12/21/2020   Moderna SARS-COV2 Booster Vaccination 12/20/2021   Moderna Sars-Covid-2 Vaccination 05/14/2019, 06/11/2019, 01/13/2020, 06/24/2020   Pfizer(Comirnaty)Fall Seasonal Vaccine 12 years and older 11/28/2022   Pneumococcal Conjugate-13 12/09/2013   Pneumococcal Polysaccharide-23 04/14/2012, 01/21/2017   Rsv, Bivalent, Protein Subunit Rsvpref,pf Pattricia Bores) 12/14/2021   Tdap 08/17/2003, 11/16/2015   Zoster Recombinant(Shingrix) 06/02/2017, 02/14/2019, 06/09/2021   Zoster, Live 11/16/2007    Health Maintenance  Topic Date Due   COVID-19 Vaccine (  8 - 2024-25 season) 05/28/2023   INFLUENZA VACCINE  10/23/2023   MAMMOGRAM  04/26/2024   Medicare Annual Wellness (AWV)  04/26/2024   DTaP/Tdap/Td (3 - Td or Tdap) 11/15/2025   Colonoscopy  11/06/2026   Pneumonia Vaccine 46+ Years old  Completed   DEXA  SCAN  Completed   Hepatitis C Screening  Completed   Zoster Vaccines- Shingrix  Completed   HPV VACCINES  Aged Out   Meningococcal B Vaccine  Aged Out    Discussed health benefits of physical activity, and encouraged her to engage in regular exercise appropriate for her age and condition.  Problem List Items Addressed This Visit       Hypertension, essential   Remains adequately controlled on current antihypertensive regimen consisting of amlodipine  10 mg daily and valsartan  320 mg daily.  No medication changes are indicated today.      Osteoporosis   T-score -2.9 on DEXA from February.  She remains on vitamin D  + calcium  supplementation and has recently started Prolia  injections.      Encounter for well adult exam with abnormal findings - Primary   Annual physical completed today.  Previous records and labs reviewed. -Repeat labs ordered -Preventative care items are largely up-to-date -We will tentatively plan for follow-up in 6 months      Return in about 6 months (around 01/31/2024).  Tobi Fortes, MD

## 2023-08-01 ENCOUNTER — Other Ambulatory Visit: Payer: Self-pay | Admitting: Internal Medicine

## 2023-08-01 DIAGNOSIS — N179 Acute kidney failure, unspecified: Secondary | ICD-10-CM

## 2023-08-01 DIAGNOSIS — E782 Mixed hyperlipidemia: Secondary | ICD-10-CM

## 2023-08-01 MED ORDER — ATORVASTATIN CALCIUM 10 MG PO TABS: 10.0000 mg | ORAL_TABLET | Freq: Every day | ORAL | 3 refills | Status: DC

## 2023-08-02 LAB — CMP14+EGFR
ALT: 17 IU/L (ref 0–32)
AST: 29 IU/L (ref 0–40)
Albumin: 4.6 g/dL (ref 3.8–4.8)
Alkaline Phosphatase: 57 IU/L (ref 44–121)
BUN/Creatinine Ratio: 22 (ref 12–28)
BUN: 27 mg/dL (ref 8–27)
Bilirubin Total: 0.7 mg/dL (ref 0.0–1.2)
CO2: 22 mmol/L (ref 20–29)
Calcium: 9.6 mg/dL (ref 8.7–10.3)
Chloride: 100 mmol/L (ref 96–106)
Creatinine, Ser: 1.24 mg/dL — ABNORMAL HIGH (ref 0.57–1.00)
Globulin, Total: 2.2 g/dL (ref 1.5–4.5)
Glucose: 94 mg/dL (ref 70–99)
Potassium: 4.6 mmol/L (ref 3.5–5.2)
Sodium: 138 mmol/L (ref 134–144)
Total Protein: 6.8 g/dL (ref 6.0–8.5)
eGFR: 45 mL/min/{1.73_m2} — ABNORMAL LOW (ref >59)

## 2023-08-02 LAB — CBC WITH DIFFERENTIAL/PLATELET
Basophils Absolute: 0 10*3/uL (ref 0.0–0.2)
Basos: 1 %
EOS (ABSOLUTE): 0.1 10*3/uL (ref 0.0–0.4)
Eos: 1 %
Hematocrit: 39.5 % (ref 34.0–46.6)
Hemoglobin: 12.7 g/dL (ref 11.1–15.9)
Immature Grans (Abs): 0 10*3/uL (ref 0.0–0.1)
Immature Granulocytes: 0 %
Lymphocytes Absolute: 1.6 10*3/uL (ref 0.7–3.1)
Lymphs: 27 %
MCH: 30.3 pg (ref 26.6–33.0)
MCHC: 32.2 g/dL (ref 31.5–35.7)
MCV: 94 fL (ref 79–97)
Monocytes Absolute: 0.5 10*3/uL (ref 0.1–0.9)
Monocytes: 7 %
Neutrophils Absolute: 3.9 10*3/uL (ref 1.4–7.0)
Neutrophils: 64 %
Platelets: 291 10*3/uL (ref 150–450)
RBC: 4.19 x10E6/uL (ref 3.77–5.28)
RDW: 13.5 % (ref 11.7–15.4)
WBC: 6.1 10*3/uL (ref 3.4–10.8)

## 2023-08-02 LAB — HEMOGLOBIN A1C
Est. average glucose Bld gHb Est-mCnc: 123 mg/dL
Hgb A1c MFr Bld: 5.9 % — ABNORMAL HIGH (ref 4.8–5.6)

## 2023-08-02 LAB — LIPID PANEL
Chol/HDL Ratio: 3 ratio (ref 0.0–4.4)
Cholesterol, Total: 221 mg/dL — ABNORMAL HIGH (ref 100–199)
HDL: 73 mg/dL (ref >39)
LDL Chol Calc (NIH): 135 mg/dL — ABNORMAL HIGH (ref 0–99)
Triglycerides: 74 mg/dL (ref 0–149)
VLDL Cholesterol Cal: 13 mg/dL (ref 5–40)

## 2023-08-02 LAB — B12 AND FOLATE PANEL: Vitamin B-12: 1453 pg/mL — ABNORMAL HIGH (ref 232–1245)

## 2023-08-02 LAB — TSH+FREE T4
Free T4: 1.73 ng/dL (ref 0.82–1.77)
TSH: 1.05 u[IU]/mL (ref 0.450–4.500)

## 2023-08-02 LAB — VITAMIN D 25 HYDROXY (VIT D DEFICIENCY, FRACTURES): Vit D, 25-Hydroxy: 134 ng/mL — ABNORMAL HIGH (ref 30.0–100.0)

## 2023-08-10 ENCOUNTER — Other Ambulatory Visit: Payer: Self-pay | Admitting: Internal Medicine

## 2023-08-10 DIAGNOSIS — I10 Essential (primary) hypertension: Secondary | ICD-10-CM

## 2023-08-10 NOTE — Telephone Encounter (Signed)
 Copied from CRM [id number]. Topic: Clinical - Medication Refill >> Aug 10, 2023  4:17 PM Turkey B wrote: Medication: amLODipine  (NORVASC ) 10 MG tablet  Has the patient contacted their pharmacy? no Pt thought she had to call in because it said no refills, I let her no she can still call pharmacy in the future  This is the patient's preferred pharmacy:  [id number] - [place], [place] - [address] AT [facility] [address] [place] [address] Phone: [phone] Fax: [phone]  Is this the correct pharmacy for this prescription? yes   Has the prescription been filled recently? no  Is the patient out of the medication? No , has a few left  Has the patient been seen for an appointment in the last year OR does the patient have an upcoming appointment? yes  Can we respond through MyChart? yes  Agent: Please be advised that Rx refills may take up to 3 business days. We ask that you follow-up with your pharmacy.

## 2023-08-11 MED ORDER — AMLODIPINE BESYLATE 10 MG PO TABS
10.0000 mg | ORAL_TABLET | Freq: Every day | ORAL | 1 refills | Status: DC
Start: 2023-08-11 — End: 2024-02-02

## 2023-08-13 ENCOUNTER — Other Ambulatory Visit: Payer: Self-pay | Admitting: Internal Medicine

## 2023-08-13 DIAGNOSIS — I10 Essential (primary) hypertension: Secondary | ICD-10-CM

## 2023-08-13 NOTE — Telephone Encounter (Signed)
 Copied from CRM [id number]. Topic: Clinical - Medication Refill >> Aug 13, 2023  8:57 AM Rosamond Comes wrote: Medication: amLODipine  (NORVASC ) 10 MG tablet  Has the patient contacted their pharmacy? Yes pharmacy has not returned call  This is the patient's preferred pharmacy:  [id number] - [place], [place] - [address] AT [facility] [address] [place] [address] Phone: [phone] Fax: [phone]  Is this the correct pharmacy for this prescription? Yes If no, delete pharmacy and type the correct one.   Has the prescription been filled recently? Yes  Is the patient out of the medication? Yes patient will be out of medication on Saturday  Has the patient been seen for an appointment in the last year OR does the patient have an upcoming appointment? Yes  Can we respond through MyChart? No  Agent: Please be advised that Rx refills may take up to 3 business days. We ask that you follow-up with your pharmacy.

## 2023-08-26 NOTE — Assessment & Plan Note (Signed)
 Remains adequately controlled on current antihypertensive regimen consisting of amlodipine  10 mg daily and valsartan  320 mg daily.  No medication changes are indicated today.

## 2023-08-26 NOTE — Assessment & Plan Note (Signed)
 Annual physical completed today.  Previous records and labs reviewed. -Repeat labs ordered -Preventative care items are largely up-to-date -We will tentatively plan for follow-up in 6 months

## 2023-08-26 NOTE — Assessment & Plan Note (Signed)
 T-score -2.9 on DEXA from February.  She remains on vitamin D  + calcium  supplementation and has recently started Prolia  injections.

## 2023-08-31 ENCOUNTER — Encounter: Payer: Self-pay | Admitting: Gastroenterology

## 2023-09-30 ENCOUNTER — Telehealth: Payer: Self-pay

## 2023-09-30 ENCOUNTER — Telehealth: Payer: Self-pay | Admitting: Internal Medicine

## 2023-09-30 MED ORDER — PANTOPRAZOLE SODIUM 40 MG PO TBEC: 40.0000 mg | DELAYED_RELEASE_TABLET | Freq: Two times a day (BID) | ORAL | 0 refills | Status: DC

## 2023-09-30 NOTE — Telephone Encounter (Signed)
 Copied from CRM [id number]. Topic: Clinical - Medication Refill >> Sep 30, 2023 12:02 PM Nathanel BROCKS wrote: Medication: pantoprazole  (PROTONIX ) 40 MG tablet  Has the patient contacted their pharmacy? Yes  This is the patient's preferred pharmacy:  [id number] - [place], [place] - [address] AT [facility] [address] [place] [address] Phone: [phone] Fax: [phone]  Is this the correct pharmacy for this prescription? Yes If no, delete pharmacy and type the correct one.   Has the prescription been filled recently? Yes  Is the patient out of the medication? Yes  Has the patient been seen for an appointment in the last year OR does the patient have an upcoming appointment? Yes  Can we respond through MyChart? No  Agent: Please be advised that Rx refills may take up to 3 business days. We ask that you follow-up with your pharmacy.

## 2023-09-30 NOTE — Telephone Encounter (Signed)
 Copied from CRM [id number]. Topic: Appointments - Transfer of Care >> Sep 30, 2023  1:53 PM Avram MATSU wrote: Pt is requesting to transfer FROM: Michelle Jackson Pt is requesting to transfer TO: Bevely Doffing Reason for requested transfer: dixon left the practice  It is the responsibility of the team the patient would like to transfer to (Dr. Bevely Doffing) to reach out to the patient if for any reason this transfer is not acceptable.

## 2023-10-01 ENCOUNTER — Other Ambulatory Visit: Payer: Self-pay

## 2023-10-01 ENCOUNTER — Telehealth: Payer: Self-pay

## 2023-10-01 NOTE — Telephone Encounter (Signed)
 Copied from CRM [id number]. Topic: Clinical - Prescription Issue >> Sep 30, 2023  1:50 PM Avram MATSU wrote: Reason for CRM: patient was added to wait list her appt is on 9/22 and is requesting a curtesy refill on pantoprazole  (PROTONIX ) 40 MG tablet  [id number] - [place], [place] - [address] AT [facility] [address] [place] [address] Phone: [phone] Fax: [phone]

## 2023-10-07 ENCOUNTER — Telehealth: Payer: Self-pay

## 2023-10-07 ENCOUNTER — Other Ambulatory Visit: Payer: Self-pay

## 2023-10-07 DIAGNOSIS — M810 Age-related osteoporosis without current pathological fracture: Secondary | ICD-10-CM

## 2023-10-07 MED ORDER — DENOSUMAB 60 MG/ML ~~LOC~~ SOSY: 60.0000 mg | PREFILLED_SYRINGE | SUBCUTANEOUS | Status: DC

## 2023-10-07 NOTE — Telephone Encounter (Signed)
 Copied from CRM [id number]. Topic: Appointments - Scheduling Inquiry for Clinic >> Oct 06, 2023  4:03 PM Jasmin G wrote: Reason for CRM: Patient needs to schedule Prolia  shot, last one was on 2/24

## 2023-10-08 ENCOUNTER — Telehealth: Payer: Self-pay

## 2023-10-08 ENCOUNTER — Other Ambulatory Visit (HOSPITAL_COMMUNITY): Payer: Self-pay

## 2023-10-08 NOTE — Telephone Encounter (Signed)
 Pt wondering when she will get it

## 2023-10-08 NOTE — Telephone Encounter (Signed)
 Pt ready for scheduling for PROLIA  on or after : 11/15/23  Option# 1: Buy/Bill (Office supplied medication)  Out-of-pocket cost due at time of clinic visit: $332  Number of injection/visits approved: ---  Primary: [employer] Prolia  co-insurance: 20% Admin fee co-insurance: 20%  Secondary: --- Prolia  co-insurance:  Admin fee co-insurance:   Medical Benefit Details: Date Benefits were checked: 10/08/23 Deductible: NO/ Coinsurance: 20%/ Admin Fee: 0%  Prior Auth: N/A PA# Expiration Date:   # of doses approved: ----------------------------------------------------------------------- Option# 2- Med Obtained from pharmacy:  Pharmacy benefit: Copay $250 (Paid to pharmacy) Admin Fee: 0% (Pay at clinic)  Prior Auth: N/A PA# Expiration Date:   # of doses approved:   If patient wants fill through the pharmacy benefit please send prescription to: [facility], and include estimated need by date in rx notes. Pharmacy will ship medication directly to the office.  Patient NOT eligible for Prolia  Copay Card. Copay Card can make patient's cost as little as $25. Link to apply: [email or web address]://[email or web address]  ** This summary of benefits is an estimation of the patient's out-of-pocket cost. Exact cost may very based on individual plan coverage.

## 2023-10-08 NOTE — Telephone Encounter (Signed)
 Prolia  VOB initiated via [email or web address]  Next Prolia  inj DUE: 11/15/23

## 2023-10-09 ENCOUNTER — Telehealth: Payer: Self-pay

## 2023-10-09 NOTE — Telephone Encounter (Signed)
 Called pt to let her know that the infusion clinic should call her to schedule her Priola

## 2023-10-09 NOTE — Telephone Encounter (Signed)
 Called pt left voicemail to call back, reason for call documented in her chart

## 2023-10-10 NOTE — Progress Notes (Unsigned)
 GI Office Note    Referring Provider: Melvenia Manus BRAVO, MD Primary Care Physician:  No primary care provider on file.  Primary Gastroenterologist: Michelle Jackson. Michelle Jackson   Chief Complaint   No chief complaint on file.   History of Present Illness   Michelle Jackson is a 78 y.o. female presenting today for follow up. Last seen in 03/2023. H/o GERD/dysphagia.     UGI series 03/02/23: IMPRESSION: 1. Abnormal appearance of the gastric fundus most compatible with unwrapped Nissen fundoplication. 2. Small to moderate-sized recurrent/residual hiatal hernia. 3. Moderate-to-large sized wide mouth diverticulum involving the distal aspect of the esophagus, superior to the GE junction. 4. Mild-to-moderate esophageal dysmotility. 5. No significant gastroesophageal reflux was visualized or elicited.   EGD 02/2023: -large hiatal hernia -gastritis, no h.pylori -medium amt of food residue in stomach, technically incomplete EGD.    Colonoscopy August 2023: -Diverticulosis in the sigmoid colon and descending colon -two 1 to 2 mm polyps in the transverse colon removed, tubular adenomas -Surveillance colonoscopy in 5 years       Medications   Current Outpatient Medications  Medication Sig Dispense Refill   amLODipine  (NORVASC ) 10 MG tablet Take 1 tablet (10 mg total) by mouth daily. 90 tablet 1   Apoaequorin (PREVAGEN PO) Take 1 tablet by mouth daily.     atorvastatin  (LIPITOR) 10 MG tablet Take 1 tablet (10 mg total) by mouth daily. 90 tablet 3   Calcium  Carbonate Antacid (CALCIUM  CARBONATE PO) Take 1,200 mg by mouth daily.     Calcium  Carbonate-Vit D-Min (CALCIUM  1200 PO) Take 600 mg by mouth 2 (two) times daily. Magnesium  Zinc Copper Manganese sulfate     Cholecalciferol (VITAMIN D ) 50 MCG (2000 UT) CAPS Take 2,000 Units by mouth daily.     Ginkgo Biloba 120 MG TABS Take 120 mg by mouth daily.     Glycerin-Hypromellose-PEG 400 (DRY EYE RELIEF DROPS OP) Place 1 drop into  both eyes daily as needed (Dry eyes).     Lysine 1000 MG TABS Take 1,000 mg by mouth daily.     Magnesium 200 MG TABS Take 400 mg by mouth daily. Gummy     Melatonin 10 MG TABS Take 20 mg by mouth at bedtime. Gummy     Multiple Vitamin (MULTIVITAMIN) tablet Take 1 tablet by mouth daily. Woman's 50+ With Iron     Omega-3 Fatty Acids (FISH OIL) 1200 MG CAPS Take 1,200 mg by mouth 2 (two) times daily. 2 daily     pantoprazole  (PROTONIX ) 40 MG tablet Take 1 tablet (40 mg total) by mouth 2 (two) times daily. 180 tablet 0   sucralfate  (CARAFATE ) 1 GM/10ML suspension Take 10 mLs (1 g total) by mouth 4 (four) times daily. 1200 mL 11   valsartan  (DIOVAN ) 320 MG tablet TAKE 1 TABLET BY MOUTH EVERY DAY 90 tablet 2   Current Facility-Administered Medications  Medication Dose Route Frequency Provider Last Rate Last Admin   denosumab  (PROLIA ) injection 60 mg  60 mg Subcutaneous Q6 months         Allergies   Allergies as of 10/12/2023 - Review Complete 07/31/2023  Allergen Reaction Noted   Iodinated contrast media Hives 10/25/2010   Other Hives 12/29/2011   Tape Other (See Comments) 01/22/2011     Past Medical History   Past Medical History:  Diagnosis Date   GERD (gastroesophageal reflux disease)    Hypertension    Neuromuscular disorder (HCC)    femoral nerve severed  during hernia surgery; wears right knee brace    Past Surgical History   Past Surgical History:  Procedure Laterality Date   BIOPSY  02/24/2023   Procedure: BIOPSY;  Surgeon: Michelle Michelle POUR, Jackson;  [facility]: [facility];  Service: Endoscopy;;   COLONOSCOPY WITH PROPOFOL  N/A 11/05/2021   Procedure: COLONOSCOPY WITH PROPOFOL ;  Surgeon: Michelle Michelle POUR, Jackson;  Location: [facility];  Service: Endoscopy;  Laterality: N/A;  11:00 ASA 2   ESOPHAGOGASTRODUODENOSCOPY (EGD) WITH PROPOFOL  N/A 02/24/2023   Procedure: ESOPHAGOGASTRODUODENOSCOPY (EGD) WITH PROPOFOL ;  Surgeon: Michelle Michelle POUR, Jackson;  Location: [facility];  Service:  Endoscopy;  Laterality: N/A;  12:30 pm, asa 2   HERNIA REPAIR     POLYPECTOMY  11/05/2021   Procedure: POLYPECTOMY;  Surgeon: Michelle Michelle POUR, Jackson;  Location: [facility];  Service: Endoscopy;;    Past Family History   Family History  Problem Relation Age of Onset   Colon cancer Neg Hx    Breast cancer Neg Hx     Past Social History   Social History   Socioeconomic History   Marital status: Divorced    Spouse name: Not on file   Number of children: 0   Years of education: Not on file   Highest education level: 12th grade  Occupational History   Not on file  Tobacco Use   Smoking status: Former    Types: Cigarettes   Smokeless tobacco: Never  Vaping Use   Vaping status: Never Used  Substance and Sexual Activity   Alcohol use: Yes    Alcohol/week: 1.0 standard drink of alcohol    Types: 1 Glasses of wine per week    Comment: occassional; social   Drug use: Never   Sexual activity: Yes    Birth control/protection: Post-menopausal  Other Topics Concern   Not on file  Social History Narrative   Lives alone but pt states she has a current boyfriend and close friends and neighbors.   Social Drivers of Corporate investment banker Strain: Low Risk  (02/23/2023)   Overall Financial Resource Strain (CARDIA)    Difficulty of Paying Living Expenses: Not hard at all  Food Insecurity: No Food Insecurity (02/23/2023)   Hunger Vital Sign    Worried About Running Out of Food in the Last Year: Never true    Ran Out of Food in the Last Year: Never true  Transportation Needs: No Transportation Needs (02/23/2023)   PRAPARE - Administrator, Civil Service (Medical): No    Lack of Transportation (Non-Medical): No  Physical Activity: Inactive (02/23/2023)   Exercise Vital Sign    Days of Exercise per Week: 0 days    Minutes of Exercise per Session: 0 min  Stress: No Stress Concern Present (02/23/2023)   [employer] of Occupational Health - Occupational Stress  Questionnaire    Feeling of Stress : Not at all  Social Connections: Moderately Isolated (02/23/2023)   Social Connection and Isolation Panel    Frequency of Communication with Friends and Family: More than three times a week    Frequency of Social Gatherings with Friends and Family: Once a week    Attends Religious Services: Never    Database administrator or Organizations: No    Attends Banker Meetings: Never    Marital Status: Married  Catering manager Violence: Not At Risk (02/23/2023)   Humiliation, Afraid, Rape, and Kick questionnaire    Fear of Current or Ex-Partner:  No    Emotionally Abused: No    Physically Abused: No    Sexually Abused: No    Review of Systems   General: Negative for anorexia, weight loss, fever, chills, fatigue, weakness. ENT: Negative for hoarseness, difficulty swallowing , nasal congestion. CV: Negative for chest pain, angina, palpitations, dyspnea on exertion, peripheral edema.  Respiratory: Negative for dyspnea at rest, dyspnea on exertion, cough, sputum, wheezing.  GI: See history of present illness. GU:  Negative for dysuria, hematuria, urinary incontinence, urinary frequency, nocturnal urination.  Endo: Negative for unusual weight change.     Physical Exam   LMP  (LMP Unknown)    General: Well-nourished, well-developed in no acute distress.  Eyes: No icterus. Mouth: Oropharyngeal mucosa moist and pink   Lungs: Clear to auscultation bilaterally.  Heart: Regular rate and rhythm, no murmurs rubs or gallops.  Abdomen: Bowel sounds are normal, nontender, nondistended, no hepatosplenomegaly or masses,  no abdominal bruits or hernia , no rebound or guarding.  Rectal: not performed Extremities: No lower extremity edema. No clubbing or deformities. Neuro: Alert and oriented x 4   Skin: Warm and dry, no jaundice.   Psych: Alert and cooperative, normal mood and affect.  Labs   Lab Results  Component Value Date   NA 138 07/31/2023    CL 100 07/31/2023   K 4.6 07/31/2023   CO2 22 07/31/2023   BUN 27 07/31/2023   CREATININE 1.24 (H) 07/31/2023   EGFR 45 (L) 07/31/2023   CALCIUM  9.6 07/31/2023   ALBUMIN 4.6 07/31/2023   GLUCOSE 94 07/31/2023   Lab Results  Component Value Date   ALT 17 07/31/2023   AST 29 07/31/2023   ALKPHOS 57 07/31/2023   BILITOT 0.7 07/31/2023   Lab Results  Component Value Date   WBC 6.1 07/31/2023   HGB 12.7 07/31/2023   HCT 39.5 07/31/2023   MCV 94 07/31/2023   PLT 291 07/31/2023   Lab Results  Component Value Date   HGBA1C 5.9 (H) 07/31/2023   Lab Results  Component Value Date   TSH 1.050 07/31/2023   Lab Results  Component Value Date   VITAMINB12 1,453 (H) 07/31/2023   Lab Results  Component Value Date   FOLATE >20.0 07/31/2023    Imaging Studies   No results found.  Assessment/Plan:        Michelle Jackson. Ezzard, MHS, PA-C [facility]

## 2023-10-12 ENCOUNTER — Encounter: Payer: Self-pay | Admitting: Gastroenterology

## 2023-10-12 ENCOUNTER — Ambulatory Visit: Admitting: Gastroenterology

## 2023-10-12 VITALS — BP 120/61 | HR 56 | Temp 98.2°F | Ht 60.0 in | Wt 81.2 lb

## 2023-10-12 DIAGNOSIS — Q396 Congenital diverticulum of esophagus: Secondary | ICD-10-CM | POA: Insufficient documentation

## 2023-10-12 DIAGNOSIS — R1310 Dysphagia, unspecified: Secondary | ICD-10-CM

## 2023-10-12 DIAGNOSIS — K449 Diaphragmatic hernia without obstruction or gangrene: Secondary | ICD-10-CM

## 2023-10-12 DIAGNOSIS — K219 Gastro-esophageal reflux disease without esophagitis: Secondary | ICD-10-CM | POA: Diagnosis not present

## 2023-10-12 DIAGNOSIS — R1319 Other dysphagia: Secondary | ICD-10-CM

## 2023-10-12 NOTE — Patient Instructions (Signed)
 You have enough refills of sucralfate  to get you through the end of the year, when you are due again we will refill for one year.  It appears your PCP has been prescribing your pantoprazole .  Return office visit in one year or sooner if you have any questions or concerns.

## 2023-10-15 ENCOUNTER — Other Ambulatory Visit (HOSPITAL_COMMUNITY): Payer: Self-pay

## 2023-10-19 ENCOUNTER — Other Ambulatory Visit: Payer: Self-pay | Admitting: Internal Medicine

## 2023-11-01 ENCOUNTER — Other Ambulatory Visit: Payer: Self-pay

## 2023-11-13 ENCOUNTER — Other Ambulatory Visit: Payer: Self-pay

## 2023-11-13 DIAGNOSIS — M810 Age-related osteoporosis without current pathological fracture: Secondary | ICD-10-CM

## 2023-11-13 MED ORDER — PROLIA 60 MG/ML ~~LOC~~ SOSY: 60.0000 mg | PREFILLED_SYRINGE | SUBCUTANEOUS | 1 refills | Status: AC

## 2023-11-16 ENCOUNTER — Other Ambulatory Visit: Payer: Self-pay

## 2023-11-17 ENCOUNTER — Ambulatory Visit (INDEPENDENT_AMBULATORY_CARE_PROVIDER_SITE_OTHER)

## 2023-11-17 DIAGNOSIS — M810 Age-related osteoporosis without current pathological fracture: Secondary | ICD-10-CM

## 2023-11-17 MED ORDER — DENOSUMAB 60 MG/ML ~~LOC~~ SOSY
60.0000 mg | PREFILLED_SYRINGE | SUBCUTANEOUS | Status: AC
  Administered 2023-11-17: 60 mg via SUBCUTANEOUS

## 2023-11-17 NOTE — Progress Notes (Signed)
 Patient is in office today for a nurse visit for  Prolia Injection . Patient Injection was given in the  Left arm. Patient tolerated injection well.

## 2023-11-20 ENCOUNTER — Telehealth: Payer: Self-pay

## 2023-11-20 NOTE — Telephone Encounter (Signed)
 Pt would need to Feb/26/2026 for her Prolia  shot

## 2023-11-20 NOTE — Telephone Encounter (Signed)
 Copied from CRM [id number]. Topic: Clinical - Medical Advice >> Nov 19, 2023  4:35 PM Tobias L wrote: Reason for CRM: Patient requesting clarification on when she is supposed to come in for next prolia  injection.   Requesting callback: [phone]

## 2023-11-20 NOTE — Telephone Encounter (Signed)
 scheduled

## 2023-11-26 ENCOUNTER — Other Ambulatory Visit (HOSPITAL_COMMUNITY): Payer: Self-pay

## 2023-12-14 ENCOUNTER — Ambulatory Visit (INDEPENDENT_AMBULATORY_CARE_PROVIDER_SITE_OTHER): Payer: Self-pay

## 2023-12-14 VITALS — BP 128/66 | HR 60 | Ht 60.0 in | Wt 80.0 lb

## 2023-12-14 DIAGNOSIS — S7411XS Injury of femoral nerve at hip and thigh level, right leg, sequela: Secondary | ICD-10-CM | POA: Diagnosis not present

## 2023-12-14 DIAGNOSIS — E782 Mixed hyperlipidemia: Secondary | ICD-10-CM | POA: Diagnosis not present

## 2023-12-14 DIAGNOSIS — R7303 Prediabetes: Secondary | ICD-10-CM

## 2023-12-14 NOTE — Progress Notes (Unsigned)
 Established Patient Office Visit  Subjective   Patient ID: Michelle Jackson, female    DOB: 05-07-1945  Age: 78 y.o. MRN: [id number]  Chief Complaint  Patient presents with   Transitions Of Care    Transfer of care from Dr Melvenia to Leita    HPI Discussed the use of AI scribe software for clinical note transcription with the patient, who gave verbal consent to proceed.  History of Present Illness   Michelle Jackson is a 78 year old female who presents for a new primary care provider consultation and evaluation of her knee brace. She is accompanied by Lynwood, who appears to be a family member or caregiver.  Unintentional weight loss and low body mass - Significant unintentional weight loss despite consuming high-calorie foods such as potatoes and milkshakes - Persistent inability to gain weight - Family history of low body weight (father was also very thin)  Knee brace fit and lower extremity atrophy - Knee brace has become too large and ineffective due to weight loss and thin legs - Current thigh circumference is 13 inches, which is smaller than the typical starting size for children's braces - Difficulty finding a properly fitting knee brace  Femoral nerve injury and mobility impairment - Femoral nerve injury occurred during hernia surgery approximately 22 years ago - Resulted in inability to walk for two years and forced retirement - Has required use of a knee brace since the injury  Osteoporosis and medication adherence - Currently receiving Prolia  injections for osteoporosis, scheduled every six months - Last Prolia  injection administered in August - Memory impairment leads to difficulty remembering timing of Prolia  injections  Gastrointestinal symptoms - Currently taking pantoprazole  for stomach issues  History of venous thromboembolism - History of blood clots following knee surgery 30 years ago - Treated with blood thinners and required frequent blood checks for six weeks  post-surgery      Patient Active Problem List   Diagnosis Date Noted   Esophageal diverticulum 10/12/2023   Acute foot pain, right 08/22/2022   Encounter for well adult exam with abnormal findings 07/28/2022   Lumbar strain 03/28/2022   Symptoms of upper respiratory infection (URI) 03/05/2022   Prediabetes 01/31/2022   Annual physical exam 07/31/2021   History of hiatal hernia 07/31/2021   Hyperlipidemia 04/01/2021   Malnutrition of mild degree 04/01/2021   Rhinorrhea 02/28/2021   Fecal urgency 12/05/2020   Pruritus 09/11/2020   Screening for colon cancer 06/13/2020   Screening due 06/13/2020   Hypertension, essential 06/13/2020   Vitamin D  deficiency 06/13/2020   Iron deficiency anemia 06/13/2020   Esophageal reflux 06/13/2020   Cognitive change 06/13/2020   Osteopenia with high risk of fracture 09/14/2018   Primary osteoarthritis of right knee 08/06/2017   Primary osteoarthritis of both knees 12/29/2016   Acute sinusitis 12/24/2013   Synovitis of knee 12/24/2013   Anemia 12/23/2013   Bilateral femoral hernia 12/23/2013   Closed nondisplaced fracture of lateral malleolus of fibula 12/23/2013   Diaphragmatic hernia 12/23/2013   Dysphagia 12/23/2013   Hernia, hiatal 12/23/2013   Nausea with vomiting 12/23/2013   Nonspecific abnormal findings on radiological and examination of intrathoracic organs 12/23/2013   Osteoporosis 12/23/2013   Recurrent right inguinal hernia 12/23/2013   Constipation 02/01/2013   Other specified postprocedural states 02/01/2013   Allergic conjunctivitis and rhinitis 12/29/2011   Dermatitis of ear canal 12/29/2011   Menopause 12/29/2011   OAB (overactive bladder) 12/29/2011   Injury to femoral nerve 03/26/2011  Allergic rhinitis 01/31/2011   Depression 01/31/2011   Right leg numbness 01/08/2011    ROS    Objective:     BP 128/66   Pulse 60   Ht 5' (1.524 m)   Wt 80 lb (36.3 kg)   LMP  (LMP Unknown)   SpO2 97%   BMI 15.62 kg/m   BP Readings from Last 3 Encounters:  12/14/23 128/66  10/12/23 120/61  07/31/23 117/64   Wt Readings from Last 3 Encounters:  12/14/23 80 lb (36.3 kg)  10/12/23 81 lb 3.2 oz (36.8 kg)  07/31/23 82 lb 3.2 oz (37.3 kg)     Physical Exam Vitals and nursing note reviewed.  Constitutional:      Appearance: Normal appearance. She is underweight.  HENT:     Head: Normocephalic.  Eyes:     Extraocular Movements: Extraocular movements intact.     Pupils: Pupils are equal, round, and reactive to light.  Cardiovascular:     Rate and Rhythm: Normal rate and regular rhythm.  Pulmonary:     Effort: Pulmonary effort is normal.     Breath sounds: Normal breath sounds.  Musculoskeletal:     Cervical back: Normal range of motion and neck supple.  Neurological:     Mental Status: She is alert and oriented to person, place, and time.  Psychiatric:        Mood and Affect: Mood normal.        Thought Content: Thought content normal.        The 10-year ASCVD risk score (Arnett DK, et al., 2019) is: 26.3%    Assessment & Plan:   Problem List Items Addressed This Visit       Nervous and Auditory   Injury to femoral nerve   Femoral nerve injury with chronic gait impairment Chronic femoral nerve injury causing significant mobility issues. Current knee brace is ill-fitting due to weight loss. - Order new knee brace with precise measurements.      Relevant Orders   For home use only DME Other see comment     Other   Hyperlipidemia - Primary   She remains on fish oil supplementation and will attempt to make dietary changes in an effort to improve her cholesterol. Recheck fasting labs today      Relevant Orders   Basic Metabolic Panel (BMET) (Completed)   Lipid Profile (Completed)   Prediabetes   A1c 5.9 on labs from May.  Dietary changes aimed at lowering her blood sugar were reviewed today.  She endorses that there is significant room for improvement.       Relevant Orders    Basic Metabolic Panel (BMET) (Completed)   HgB A1c (Completed)      Return in about 6 months (around 06/12/2024) for chronic follow-up with PCP.    Leita Longs, FNP

## 2023-12-15 LAB — BASIC METABOLIC PANEL WITH GFR
BUN/Creatinine Ratio: 24 (ref 12–28)
BUN: 20 mg/dL (ref 8–27)
CO2: 25 mmol/L (ref 20–29)
Calcium: 10.5 mg/dL — ABNORMAL HIGH (ref 8.7–10.3)
Chloride: 102 mmol/L (ref 96–106)
Creatinine, Ser: 0.85 mg/dL (ref 0.57–1.00)
Glucose: 100 mg/dL — ABNORMAL HIGH (ref 70–99)
Potassium: 4.8 mmol/L (ref 3.5–5.2)
Sodium: 142 mmol/L (ref 134–144)
eGFR: 71 mL/min/1.73 (ref >59)

## 2023-12-15 LAB — LIPID PANEL
Chol/HDL Ratio: 2.9 ratio (ref 0.0–4.4)
Cholesterol, Total: 214 mg/dL — ABNORMAL HIGH (ref 100–199)
HDL: 73 mg/dL (ref >39)
LDL Chol Calc (NIH): 120 mg/dL — ABNORMAL HIGH (ref 0–99)
Triglycerides: 122 mg/dL (ref 0–149)
VLDL Cholesterol Cal: 21 mg/dL (ref 5–40)

## 2023-12-15 LAB — HEMOGLOBIN A1C
Est. average glucose Bld gHb Est-mCnc: 114 mg/dL
Hgb A1c MFr Bld: 5.6 % (ref 4.8–5.6)

## 2023-12-17 ENCOUNTER — Ambulatory Visit: Payer: Self-pay

## 2023-12-17 NOTE — Assessment & Plan Note (Signed)
 Femoral nerve injury with chronic gait impairment Chronic femoral nerve injury causing significant mobility issues. Current knee brace is ill-fitting due to weight loss. - Order new knee brace with precise measurements.

## 2023-12-17 NOTE — Assessment & Plan Note (Signed)
 A1c 5.9 on labs from May.  Dietary changes aimed at lowering her blood sugar were reviewed today.  She endorses that there is significant room for improvement.

## 2023-12-17 NOTE — Assessment & Plan Note (Signed)
 She remains on fish oil supplementation and will attempt to make dietary changes in an effort to improve her cholesterol. Recheck fasting labs today

## 2024-02-02 ENCOUNTER — Ambulatory Visit (INDEPENDENT_AMBULATORY_CARE_PROVIDER_SITE_OTHER)

## 2024-02-02 VITALS — BP 135/70 | HR 60 | Ht 60.0 in | Wt 80.0 lb

## 2024-02-02 DIAGNOSIS — R200 Anesthesia of skin: Secondary | ICD-10-CM

## 2024-02-02 DIAGNOSIS — S7411XS Injury of femoral nerve at hip and thigh level, right leg, sequela: Secondary | ICD-10-CM

## 2024-02-02 DIAGNOSIS — I10 Essential (primary) hypertension: Secondary | ICD-10-CM

## 2024-02-02 DIAGNOSIS — K219 Gastro-esophageal reflux disease without esophagitis: Secondary | ICD-10-CM | POA: Diagnosis not present

## 2024-02-02 DIAGNOSIS — R4189 Other symptoms and signs involving cognitive functions and awareness: Secondary | ICD-10-CM

## 2024-02-02 MED ORDER — MEMANTINE HCL 5 MG PO TABS: 5.0000 mg | ORAL_TABLET | Freq: Two times a day (BID) | ORAL | 5 refills | Status: DC

## 2024-02-02 MED ORDER — AMLODIPINE BESYLATE 10 MG PO TABS: 10.0000 mg | ORAL_TABLET | Freq: Every day | ORAL | 3 refills | Status: AC

## 2024-02-02 MED ORDER — VALSARTAN 320 MG PO TABS: 320.0000 mg | ORAL_TABLET | Freq: Every day | ORAL | 3 refills | Status: AC

## 2024-02-02 MED ORDER — PANTOPRAZOLE SODIUM 40 MG PO TBEC: 40.0000 mg | DELAYED_RELEASE_TABLET | Freq: Two times a day (BID) | ORAL | 3 refills | Status: AC

## 2024-02-02 NOTE — Progress Notes (Unsigned)
   Established Patient Office Visit  Subjective   Patient ID: Michelle Jackson, female    DOB: [birth year]  Age: 78 y.o. MRN: [id number]  Chief Complaint  Patient presents with   Medical Management of Chronic Issues    Pt here for a 6 month follow up also family in room has concerns of worsening Short Term Memory Loss and confusion and pt also needs a new leg brace    HPI  Patient Active Problem List   Diagnosis Date Noted   Esophageal diverticulum 10/12/2023   Acute foot pain, right 08/22/2022   Encounter for well adult exam with abnormal findings 07/28/2022   Lumbar strain 03/28/2022   Symptoms of upper respiratory infection (URI) 03/05/2022   Prediabetes 01/31/2022   Annual physical exam 07/31/2021   History of hiatal hernia 07/31/2021   Hyperlipidemia 04/01/2021   Malnutrition of mild degree 04/01/2021   Rhinorrhea 02/28/2021   Fecal urgency 12/05/2020   Pruritus 09/11/2020   Screening for colon cancer 06/13/2020   Screening due 06/13/2020   Hypertension, essential 06/13/2020   Vitamin D  deficiency 06/13/2020   Iron deficiency anemia 06/13/2020   Esophageal reflux 06/13/2020   Cognitive change 06/13/2020   Osteopenia with high risk of fracture 09/14/2018   Primary osteoarthritis of right knee 08/06/2017   Primary osteoarthritis of both knees 12/29/2016   Acute sinusitis 12/24/2013   Synovitis of knee 12/24/2013   Anemia 12/23/2013   Bilateral femoral hernia 12/23/2013   Closed nondisplaced fracture of lateral malleolus of fibula 12/23/2013   Diaphragmatic hernia 12/23/2013   Dysphagia 12/23/2013   Hernia, hiatal 12/23/2013   Nausea with vomiting 12/23/2013   Nonspecific abnormal findings on radiological and examination of intrathoracic organs 12/23/2013   Osteoporosis 12/23/2013   Recurrent right inguinal hernia 12/23/2013   Constipation 02/01/2013   Other specified postprocedural states 02/01/2013   Allergic conjunctivitis and rhinitis 12/29/2011   Dermatitis of  ear canal 12/29/2011   Menopause 12/29/2011   OAB (overactive bladder) 12/29/2011   Injury to femoral nerve 03/26/2011   Allergic rhinitis 01/31/2011   Depression 01/31/2011   Right leg numbness 01/08/2011    ROS    Objective:     BP 135/70   Pulse 60   Ht 5' (1.524 m)   Wt 80 lb (36.3 kg)   LMP  (LMP Unknown)   SpO2 97%   BMI 15.62 kg/m  BP Readings from Last 3 Encounters:  02/02/24 135/70  12/14/23 128/66  10/12/23 120/61   Wt Readings from Last 3 Encounters:  02/02/24 80 lb (36.3 kg)  12/14/23 80 lb (36.3 kg)  10/12/23 81 lb 3.2 oz (36.8 kg)      Physical Exam   No results found for any visits on 02/02/24.  {Labs (Optional):23779}  The 10-year ASCVD risk score (Arnett DK, et al., 2019) is: 28.8%    Assessment & Plan:   Problem List Items Addressed This Visit   None   No follow-ups on file.    Leita Longs, FNP

## 2024-02-04 NOTE — Assessment & Plan Note (Signed)
 Stable with current dose of Protonix  40 mg.  No medication changes made today.

## 2024-02-04 NOTE — Assessment & Plan Note (Signed)
 Remains adequately controlled on current antihypertensive regimen consisting of amlodipine  10 mg daily and valsartan  320 mg daily.  No medication changes are indicated today.
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
## 2024-02-04 NOTE — Assessment & Plan Note (Signed)
 Femoral nerve injury with chronic gait impairment Chronic femoral nerve injury causing significant mobility issues. Current knee brace is ill-fitting due to weight loss. - Order new knee brace with precise measurements.

## 2024-02-09 ENCOUNTER — Telehealth: Payer: Self-pay

## 2024-02-09 NOTE — Telephone Encounter (Signed)
 Copied from CRM [id number]. Topic: Clinical - Order For Equipment >> Feb 09, 2024 11:50 AM Tinnie BROCKS wrote: Reason for CRM: Arzella Aquas (on dpr) calling for update on leg brace that was to be ordered. Requesting call back at [phone].

## 2024-02-10 NOTE — Telephone Encounter (Signed)
 Talked to cousin Karna and she is aware that she needs to call [facility] to set up appointment, also gave her the address, phone number as well

## 2024-02-10 NOTE — Telephone Encounter (Signed)
 Noted

## 2024-02-17 ENCOUNTER — Telehealth: Payer: Self-pay

## 2024-02-17 NOTE — Telephone Encounter (Signed)
 Copied from CRM [id number]. Topic: Clinical - Order For Equipment >> Feb 17, 2024 12:24 PM Delon T wrote: Reason for CRM: need to speak with someone about the leg brace, she has not had any call back about it after being measured- [phone]

## 2024-02-17 NOTE — Telephone Encounter (Signed)
 Patient needs to reach out to [facility], cousin denise has phone number and address

## 2024-02-17 NOTE — Telephone Encounter (Signed)
 Patient had prolia  injection in August, not due until feb. Patient advised

## 2024-02-17 NOTE — Telephone Encounter (Signed)
 Copied from CRM #[id number]. Topic: Clinical - Medication Question >> Feb 17, 2024  1:36 PM Delon DASEN wrote: Reason for CRM: Patient has a question whether she had a prolia  shot at her last visit- please call to clarify [phone]

## 2024-02-24 ENCOUNTER — Other Ambulatory Visit: Payer: Self-pay

## 2024-02-24 DIAGNOSIS — R4189 Other symptoms and signs involving cognitive functions and awareness: Secondary | ICD-10-CM

## 2024-02-24 DIAGNOSIS — H25813 Combined forms of age-related cataract, bilateral: Secondary | ICD-10-CM | POA: Diagnosis not present

## 2024-02-25 ENCOUNTER — Telehealth: Payer: Self-pay

## 2024-02-25 NOTE — Telephone Encounter (Signed)
 Copied from CRM #[id number]. Topic: General - Other >> Feb 24, 2024  5:39 PM Delon T wrote: Reason for CRM: returning call she missed- advised it was the [facility] Annual Wellness appt, she said she didn't know anything about it. They have already scheduled another one for 12/17.

## 2024-02-25 NOTE — Telephone Encounter (Signed)
 Noted

## 2024-03-07 ENCOUNTER — Ambulatory Visit

## 2024-03-07 VITALS — Ht 60.0 in | Wt 77.0 lb

## 2024-03-07 DIAGNOSIS — Z0000 Encounter for general adult medical examination without abnormal findings: Secondary | ICD-10-CM | POA: Diagnosis not present

## 2024-03-07 DIAGNOSIS — Z780 Asymptomatic menopausal state: Secondary | ICD-10-CM

## 2024-03-07 DIAGNOSIS — Z1231 Encounter for screening mammogram for malignant neoplasm of breast: Secondary | ICD-10-CM

## 2024-03-07 NOTE — Progress Notes (Signed)
 Chief Complaint  Patient presents with   Medicare Wellness     Subjective:   Michelle Jackson is a 78 y.o. female who presents for a Medicare Annual Wellness Visit.  Visit info / Clinical Intake: Medicare Wellness Visit Type:: Subsequent Annual Wellness Visit Persons participating in visit and providing information:: patient Medicare Wellness Visit Mode:: Telephone If telephone:: video declined Since this visit was completed virtually, some vitals may be partially provided or unavailable. Missing vitals are due to the limitations of the virtual format.: Documented vitals are patient reported If Telephone or Video please confirm:: I connected with patient using audio/video enable telemedicine. I verified patient identity with two identifiers, discussed telehealth limitations, and patient agreed to proceed. Patient Location:: home Provider Location:: home office Interpreter Needed?: No Pre-visit prep was completed: yes AWV questionnaire completed by patient prior to visit?: no Living arrangements:: (!) lives alone Patient's Overall Health Status Rating: good Typical amount of pain: some Does pain affect daily life?: no Are you currently prescribed opioids?: no  Dietary Habits and Nutritional Risks How many meals a day?: (!) 1 (pt states she isn't a big eater but snacks a lot) Eats fruit and vegetables daily?: yes Most meals are obtained by: preparing own meals In the last 2 weeks, have you had any of the following?: none Diabetic:: no  Functional Status Activities of Daily Living (to include ambulation/medication): Independent Ambulation: Independent Medication Administration: Independent Home Management (perform basic housework or laundry): Independent Manage your own finances?: yes Primary transportation is: driving Concerns about vision?: no *vision screening is required for WTM* Concerns about hearing?: no  Fall Screening Falls in the past year?: 1 Number of falls in  past year: 0 Was there an injury with Fall?: 0 Fall Risk Category Calculator: 1 Patient Fall Risk Level: Low Fall Risk  Fall Risk Patient at Risk for Falls Due to: History of fall(s); Impaired balance/gait Fall risk Follow up: Falls evaluation completed; Education provided; Falls prevention discussed  Home and Transportation Safety: All rugs have non-skid backing?: yes All stairs or steps have railings?: N/A, no stairs Grab bars in the bathtub or shower?: yes Have non-skid surface in bathtub or shower?: yes Good home lighting?: yes Regular seat belt use?: yes Hospital stays in the last year:: no  Cognitive Assessment Difficulty concentrating, remembering, or making decisions? : yes Will 6CIT or Mini Cog be Completed: no 6CIT or Mini Cog Declined: patient has a diagnosis of dementia or cognitive impairment  Advance Directives (For Healthcare) Does Patient Have a Medical Advance Directive?: No Would patient like information on creating a medical advance directive?: No - Patient declined  Reviewed/Updated  Reviewed/Updated: Reviewed All (Medical, Surgical, Family, Medications, Allergies, Care Teams, Patient Goals)    Allergies (verified) Iodinated contrast media, Other, and Tape   Current Medications (verified) Outpatient Encounter Medications as of 03/07/2024  Medication Sig   amLODipine  (NORVASC ) 10 MG tablet Take 1 tablet (10 mg total) by mouth daily.   Apoaequorin (PREVAGEN PO) Take 1 tablet by mouth daily.   Calcium  Carbonate Antacid (CALCIUM  CARBONATE PO) Take 1,200 mg by mouth daily.   Calcium  Carbonate-Vit D-Min (CALCIUM  1200 PO) Take 600 mg by mouth 2 (two) times daily. Magnesium  Zinc Copper Manganese sulfate   Cholecalciferol (VITAMIN D ) 50 MCG (2000 UT) CAPS Take 2,000 Units by mouth daily.   denosumab  (PROLIA ) 60 MG/ML SOSY injection Inject 60 mg into the skin every 6 (six) months.   Ginkgo Biloba 120 MG TABS Take 120 mg by  mouth daily.    Glycerin-Hypromellose-PEG 400 (DRY EYE RELIEF DROPS OP) Place 1 drop into both eyes daily as needed (Dry eyes).   Lysine 1000 MG TABS Take 1,000 mg by mouth daily.   Magnesium 200 MG TABS Take 400 mg by mouth daily. Gummy   Melatonin 10 MG TABS Take 20 mg by mouth at bedtime. Gummy   memantine  (NAMENDA ) 5 MG tablet TAKE 1 TABLET BY MOUTH TWICE A DAY   Multiple Vitamin (MULTIVITAMIN) tablet Take 1 tablet by mouth daily. Woman's 50+ With Iron   Omega-3 Fatty Acids (FISH OIL) 1200 MG CAPS Take 1,200 mg by mouth 2 (two) times daily. 2 daily   pantoprazole  (PROTONIX ) 40 MG tablet Take 1 tablet (40 mg total) by mouth 2 (two) times daily.   valsartan  (DIOVAN ) 320 MG tablet Take 1 tablet (320 mg total) by mouth daily.   sucralfate  (CARAFATE ) 1 GM/10ML suspension Take 10 mLs (1 g total) by mouth 4 (four) times daily.   Facility-Administered Encounter Medications as of 03/07/2024  Medication   denosumab  (PROLIA ) injection 60 mg    History: Past Medical History:  Diagnosis Date   GERD (gastroesophageal reflux disease)    Hypertension    Neuromuscular disorder (HCC)    femoral nerve severed during hernia surgery; wears right knee brace   Past Surgical History:  Procedure Laterality Date   BIOPSY  02/24/2023   Procedure: BIOPSY;  Surgeon: Cindie Carlin POUR, DO;  [facility];  Service: Endoscopy;;   COLONOSCOPY WITH PROPOFOL  N/A 11/05/2021   Procedure: COLONOSCOPY WITH PROPOFOL ;  Surgeon: Cindie Carlin POUR, DO;  [facility];  Service: Endoscopy;  Laterality: N/A;  11:00 ASA 2   ESOPHAGOGASTRODUODENOSCOPY (EGD) WITH PROPOFOL  N/A 02/24/2023   Procedure: ESOPHAGOGASTRODUODENOSCOPY (EGD) WITH PROPOFOL ;  Surgeon: Cindie Carlin POUR, DO;  [facility]: [facility];  Service: Endoscopy;  Laterality: N/A;  12:30 pm, asa 2   HERNIA REPAIR     POLYPECTOMY  11/05/2021   Procedure: POLYPECTOMY;  Surgeon: Cindie Carlin POUR, DO;  Location: [facility];  Service: Endoscopy;;   Family  History  Problem Relation Age of Onset   Colon cancer Neg Hx    Breast cancer Neg Hx    Social History   Occupational History   Not on file  Tobacco Use   Smoking status: Former    Types: Cigarettes   Smokeless tobacco: Never  Vaping Use   Vaping status: Never Used  Substance and Sexual Activity   Alcohol use: Yes    Alcohol/week: 1.0 standard drink of alcohol    Types: 1 Glasses of wine per week    Comment: occassional; social   Drug use: Never   Sexual activity: Yes    Birth control/protection: Post-menopausal   Tobacco Counseling Counseling given: Yes  SDOH Screenings   Food Insecurity: No Food Insecurity (03/07/2024)  Housing: Low Risk (03/07/2024)  Transportation Needs: No Transportation Needs (03/07/2024)  Utilities: Not At Risk (03/07/2024)  Alcohol Screen: Low Risk (02/23/2023)  Depression (PHQ2-9): Low Risk (03/07/2024)  Financial Resource Strain: Low Risk (12/13/2023)  Physical Activity: Inactive (03/07/2024)  Social Connections: Unknown (03/07/2024)  Recent Concern: Social Connections - Socially Isolated (12/13/2023)  Stress: No Stress Concern Present (03/07/2024)  Tobacco Use: Medium Risk (03/07/2024)  Health Literacy: Adequate Health Literacy (03/07/2024)   See flowsheets for full screening details  Depression Screen PHQ 2 & 9 Depression Scale- Over the past 2 weeks, how often have you been bothered by any of the following problems?  Little interest or pleasure in doing things: 0 Feeling down, depressed, or hopeless (PHQ Adolescent also includes...irritable): 0 PHQ-2 Total Score: 0 Trouble falling or staying asleep, or sleeping too much: 0 Feeling tired or having little energy: 0 Poor appetite or overeating (PHQ Adolescent also includes...weight loss): 0 Feeling bad about yourself - or that you are a failure or have let yourself or your family down: 0 Trouble concentrating on things, such as reading the newspaper or watching television (PHQ Adolescent  also includes...like school work): 0 Moving or speaking so slowly that other people could have noticed. Or the opposite - being so fidgety or restless that you have been moving around a lot more than usual: 0 Thoughts that you would be better off dead, or of hurting yourself in some way: 0 PHQ-9 Total Score: 0 If you checked off any problems, how difficult have these problems made it for you to do your work, take care of things at home, or get along with other people?: Not difficult at all  Depression Treatment Depression Interventions/Treatment : EYV7-0 Score <4 Follow-up Not Indicated     Goals Addressed               This Visit's Progress     Patient Stated   On track     Gain weight       remain active and healthy (pt-stated)               Objective:    Today's Vitals   03/07/24 1600  Weight: 77 lb (34.9 kg)  Height: 5' (1.524 m)   Body mass index is 15.04 kg/m.  Hearing/Vision screen Hearing Screening - Comments:: Patient denies any hearing difficulties.   Vision Screening - Comments:: Wears rx glasses - up to date with routine eye exams with  Oneil Kawasaki Immunizations and Health Maintenance Health Maintenance  Topic Date Due   Medicare Annual Wellness (AWV)  02/23/2024   Mammogram  04/26/2024   COVID-19 Vaccine (9 - 2025-26 season) 06/03/2024   Bone Density Scan  04/26/2025   DTaP/Tdap/Td (3 - Td or Tdap) 11/15/2025   Colonoscopy  11/06/2026   Pneumococcal Vaccine: 50+ Years  Completed   Influenza Vaccine  Completed   Hepatitis C Screening  Completed   Zoster Vaccines- Shingrix  Completed   Meningococcal B Vaccine  Aged Out        Assessment/Plan:  This is a routine wellness examination for Ryiah.  Patient Care Team: Bevely Doffing, FNP as PCP - General (Family Medicine) Cindie Carlin POUR, DO as Consulting Physician (Gastroenterology) Kawasaki Oneil, DO (Optometry) Ezzard Sonny GORMAN DEVONNA as Physician Assistant (Gastroenterology) Shirlean Therisa ORN,  NP as Chaplain (Gastroenterology)  I have personally reviewed and noted the following in the patients chart:   Medical and social history Use of alcohol, tobacco or illicit drugs  Current medications and supplements including opioid prescriptions. Functional ability and status Nutritional status Physical activity Advanced directives List of other physicians Hospitalizations, surgeries, and ER visits in previous 12 months Vitals Screenings to include cognitive, depression, and falls Referrals and appointments  Orders Placed This Encounter  Procedures   MM 3D SCREENING MAMMOGRAM BILATERAL BREAST    Standing Status:   Future    Expiration Date:   03/07/2025    Reason for Exam (SYMPTOM  OR DIAGNOSIS REQUIRED):   breast cancer screening    Preferred imaging location?:   [facility]   DG Bone Density    Standing Status:   Future  Expiration Date:   03/07/2025    Reason for Exam (SYMPTOM  OR DIAGNOSIS REQUIRED):   post menopausal estrogen deficient    Preferred imaging location?:   [facility]   In addition, I have reviewed and discussed with patient certain preventive protocols, quality metrics, and best practice recommendations. A written personalized care plan for preventive services as well as general preventive health recommendations were provided to patient.   Jerell Demery, CMA   03/07/2024   Return on Wednesday March 08, 2025 at 10:00 am, for your yearly Medicare Wellness Visit in person.  After Visit Summary: (MyChart) Due to this being a telephonic visit, the after visit summary with patients personalized plan was offered to patient via MyChart   HM Addressed: Mammogram ordered DEXA ordered 6CIT not completed due to patient having a diagnosis of dementia/cognitive decline in chart and on Memantine 

## 2024-03-07 NOTE — Patient Instructions (Signed)
 Michelle Jackson,  Thank you for taking the time for your Medicare Wellness Visit. I appreciate your continued commitment to your health goals. Please review the care plan we discussed, and feel free to reach out if I can assist you further.  Please note that Annual Wellness Visits do not include a physical exam. Some assessments may be limited, especially if the visit was conducted virtually. If needed, we may recommend an in-person follow-up with your provider.  Ongoing Care Seeing your primary care provider every 3 to 6 months helps us  monitor your health and provide consistent, personalized care.   1 year follow up for Medicare well visit: Wednesday March 08, 2025 at 10:00 am with medicare wellness nurse in office  Referrals If a referral was made during today's visit and you haven't received any updates within two weeks, please contact the referred provider directly to check on the status.  Mammogram/Bone Density Screening: Call [facility] @ Phone: [phone]   Recommended Screenings:  Health Maintenance  Topic Date Due   Medicare Annual Wellness Visit  02/23/2024   Breast Cancer Screening  04/26/2024   COVID-19 Vaccine (9 - 2025-26 season) 06/03/2024   Osteoporosis screening with Bone Density Scan  04/26/2025   DTaP/Tdap/Td vaccine (3 - Td or Tdap) 11/15/2025   Colon Cancer Screening  11/06/2026   Pneumococcal Vaccine for age over 75  Completed   Flu Shot  Completed   Hepatitis C Screening  Completed   Zoster (Shingles) Vaccine  Completed   Meningitis B Vaccine  Aged Out       03/07/2024    4:05 PM  Advanced Directives  Does Patient Have a Medical Advance Directive? No  Would patient like information on creating a medical advance directive? No - Patient declined    Vision: Annual vision screenings are recommended for early detection of glaucoma, cataracts, and diabetic retinopathy. These exams can also reveal signs of chronic conditions such as diabetes and  high blood pressure.  Dental: Annual dental screenings help detect early signs of oral cancer, gum disease, and other conditions linked to overall health, including heart disease and diabetes.  Please see the attached documents for additional preventive care recommendations.

## 2024-03-09 ENCOUNTER — Ambulatory Visit: Payer: Self-pay

## 2024-03-30 ENCOUNTER — Telehealth: Payer: Self-pay

## 2024-03-30 NOTE — Telephone Encounter (Signed)
 Copied from CRM [id number]. Topic: Clinical - Medication Question >> Mar 30, 2024  3:48 PM Chasity T wrote: Reason for CRM: Karna relative of patient is asking for a nurse to call her back due to concerns with medication sucralfate  (CARAFATE ) 1 GM/10ML suspension. States she  needs to cut back on medication its to much on her body . takes 1 tbps 4xs a day.  Requesting to call back after 2pm tomorrow. [phone]

## 2024-03-30 NOTE — Telephone Encounter (Signed)
 Okay to cut back to 2-3 times a day.

## 2024-04-01 ENCOUNTER — Other Ambulatory Visit: Payer: Self-pay

## 2024-04-01 MED ORDER — SUCRALFATE 1 GM/10ML PO SUSP: 1.0000 g | Freq: Four times a day (QID) | ORAL | 5 refills | Status: AC

## 2024-04-01 NOTE — Telephone Encounter (Signed)
 Refill of Carafate  sent to [facility]

## 2024-04-01 NOTE — Telephone Encounter (Signed)
 Can we send a new RX for the Carafate  Karna relative to pt states too many bottles and she already don't take it like she should

## 2024-04-18 ENCOUNTER — Ambulatory Visit: Payer: Self-pay

## 2024-04-18 ENCOUNTER — Ambulatory Visit: Admitting: Gastroenterology
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
## 2024-04-19 ENCOUNTER — Telehealth: Payer: Self-pay

## 2024-04-19 NOTE — Telephone Encounter (Signed)
 LVM

## 2024-04-19 NOTE — Telephone Encounter (Signed)
 Attempted to reach patient- pt answered and I couldn't hear anything on the other side.

## 2024-04-19 NOTE — Telephone Encounter (Signed)
 Copied from CRM #[id number]. Topic: Appointments - Appointment Cancel/Reschedule >> Apr 18, 2024  3:57 PM Victoria B wrote: Patient needs to reschedle her prolia  shot for 02/11

## 2024-04-25 ENCOUNTER — Telehealth: Payer: Self-pay

## 2024-04-25 ENCOUNTER — Other Ambulatory Visit (HOSPITAL_COMMUNITY): Payer: Self-pay

## 2024-04-25 NOTE — Telephone Encounter (Signed)
 Prolia  VOB initiated via [email or web address]  Next Prolia  inj DUE: 05/19/24

## 2024-04-27 ENCOUNTER — Ambulatory Visit (HOSPITAL_COMMUNITY): Admission: RE | Admit: 2024-04-27 | Discharge: 2024-04-27 | Disposition: A | Source: Ambulatory Visit

## 2024-04-27 ENCOUNTER — Other Ambulatory Visit (HOSPITAL_COMMUNITY): Payer: Self-pay

## 2024-04-27 ENCOUNTER — Encounter (HOSPITAL_COMMUNITY): Payer: Self-pay

## 2024-04-27 DIAGNOSIS — Z1231 Encounter for screening mammogram for malignant neoplasm of breast: Secondary | ICD-10-CM

## 2024-04-27 NOTE — Telephone Encounter (Signed)
 Pt ready for scheduling for PROLIA  on or after : 05/19/24  Option# 1: Buy/Bill (Office supplied medication)  Out-of-pocket cost due at time of clinic visit: $352  Number of injection/visits approved: ---  Primary: [employer] Prolia  co-insurance: 20% Admin fee co-insurance: 0%  Secondary: --- Prolia  co-insurance:  Admin fee co-insurance:   Medical Benefit Details: Date Benefits were checked: 04/25/24 Deductible: NO/ Coinsurance: 20%/ Admin Fee: 0%  Prior Auth: N/A PA# Expiration Date:   # of doses approved: ----------------------------------------------------------------------- Option# 2- Med Obtained from pharmacy: [facility]REFERRED FOR PHARMACY BENEFIT  Pharmacy benefit: Copay $629.60 (Paid to pharmacy) Admin Fee: 0% (Pay at clinic)  Prior Auth: N/A PA# Expiration Date:   # of doses approved:   If patient wants fill through the pharmacy benefit please send prescription to: [facility], and include estimated need by date in rx notes. Pharmacy will ship medication directly to the office.  Patient NOT eligible for Prolia  Copay Card. Copay Card can make patient's cost as little as $25. Link to apply: [email or web address]  ** This summary of benefits is an estimation of the patient's out-of-pocket cost. Exact cost may very based on individual plan coverage.

## 2024-05-04 ENCOUNTER — Ambulatory Visit

## 2024-06-01 ENCOUNTER — Ambulatory Visit: Admitting: Gastroenterology

## 2024-06-13 ENCOUNTER — Ambulatory Visit

## 2024-11-16 ENCOUNTER — Ambulatory Visit

## 2025-03-08 ENCOUNTER — Ambulatory Visit: Payer: Self-pay
